# Patient Record
Sex: Female | Born: 1955 | Race: White | Hispanic: No | Marital: Married | Smoking: Never smoker
Health system: Southern US, Community
[De-identification: ages and names within clinical notes are randomized; demographics above are authoritative.]

## PROBLEM LIST (undated history)

## (undated) DIAGNOSIS — I10 Essential (primary) hypertension: Secondary | ICD-10-CM

## (undated) DIAGNOSIS — E119 Type 2 diabetes mellitus without complications: Secondary | ICD-10-CM

---

## 2004-05-24 ENCOUNTER — Other Ambulatory Visit: Admission: RE | Admit: 2004-05-24 | Discharge: 2004-05-24 | Payer: Self-pay | Admitting: Family Medicine

## 2007-09-30 ENCOUNTER — Observation Stay (HOSPITAL_COMMUNITY): Admission: EM | Admit: 2007-09-30 | Discharge: 2007-10-02 | Payer: Self-pay | Admitting: Emergency Medicine

## 2007-09-30 IMAGING — CR DG CHEST 2V
2 series · 2 of 2 positions shown · non-contrast
Comparison: None.

CHEST - 2 VIEW
CLINICAL DATA: Short of breath, fever.

[w chest pa]
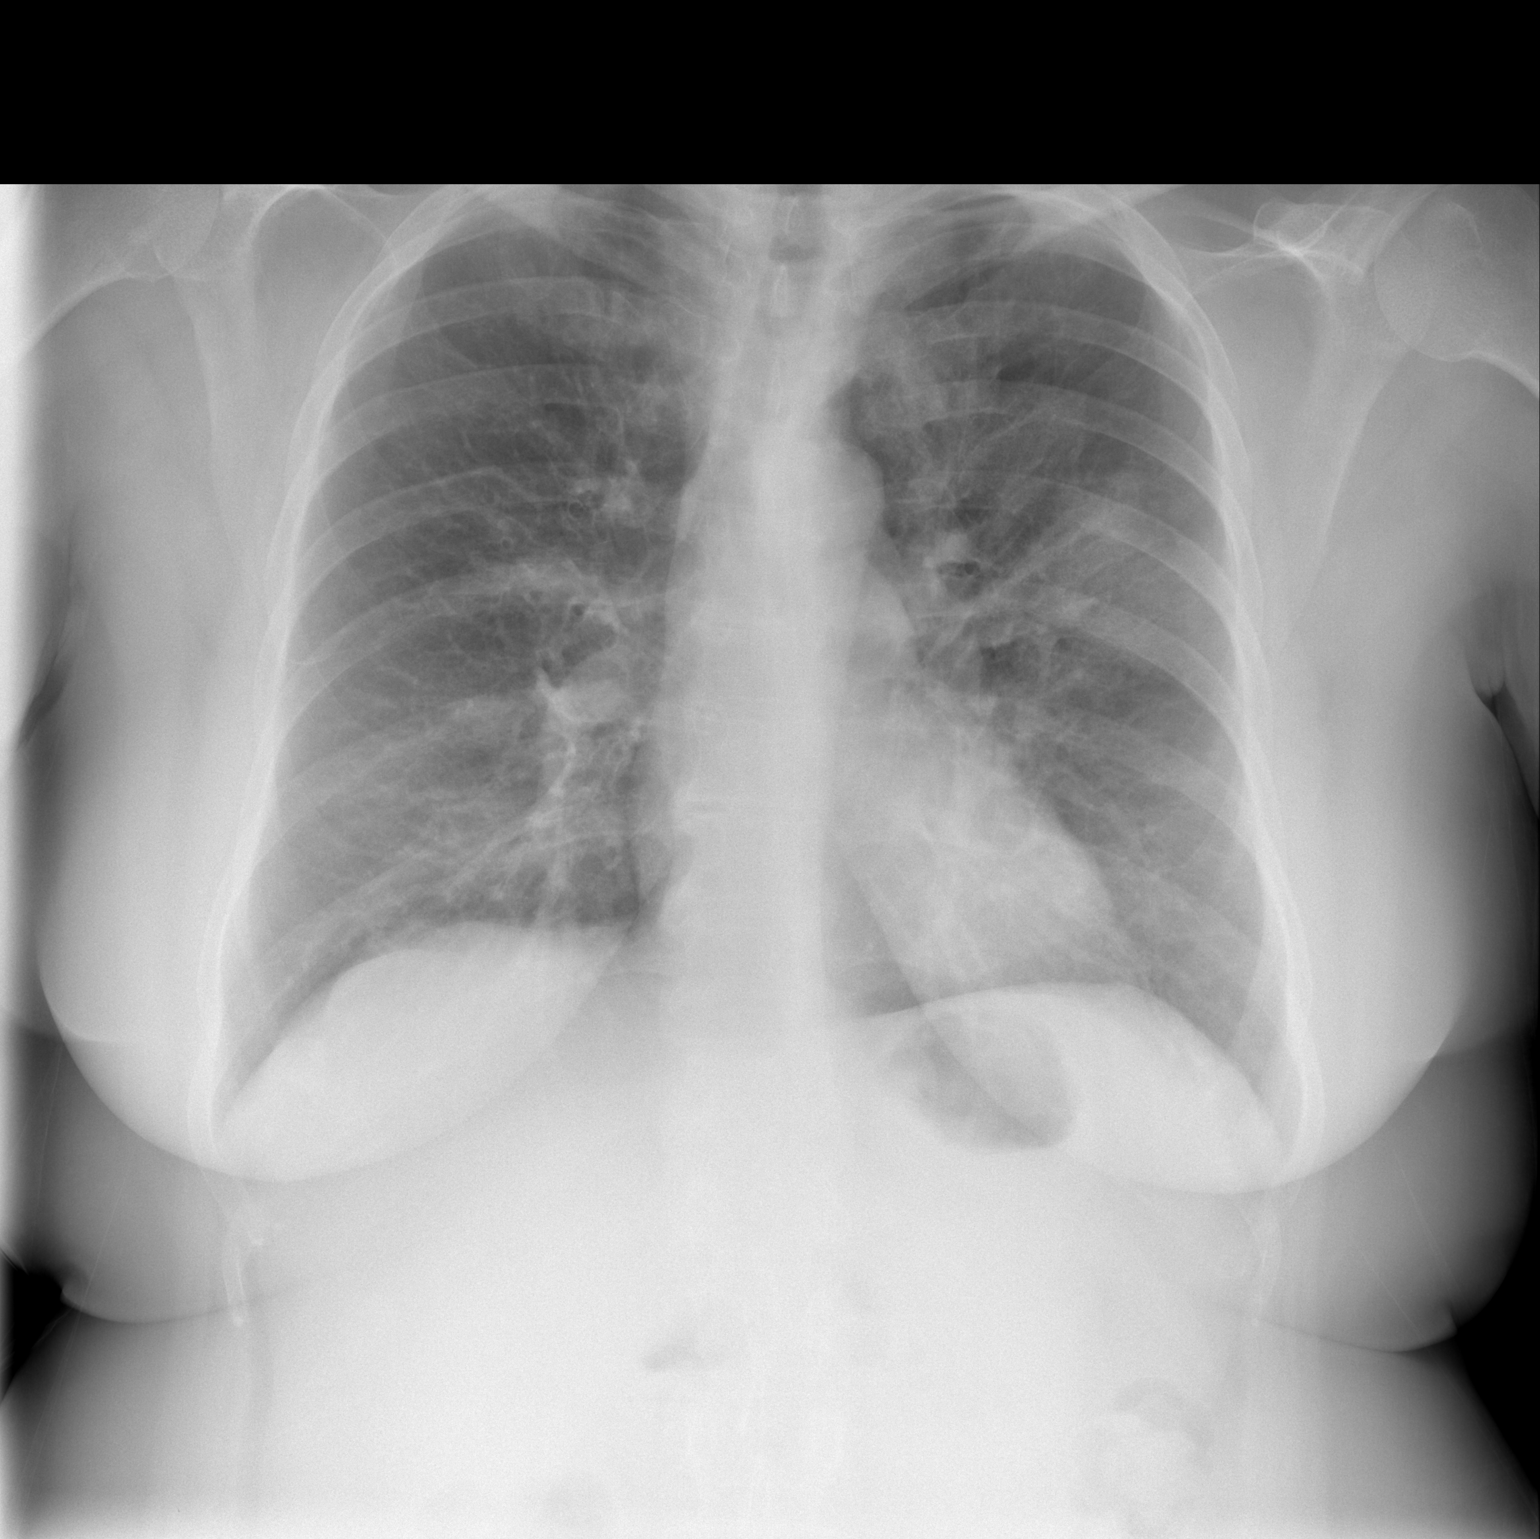

[w chest lat]
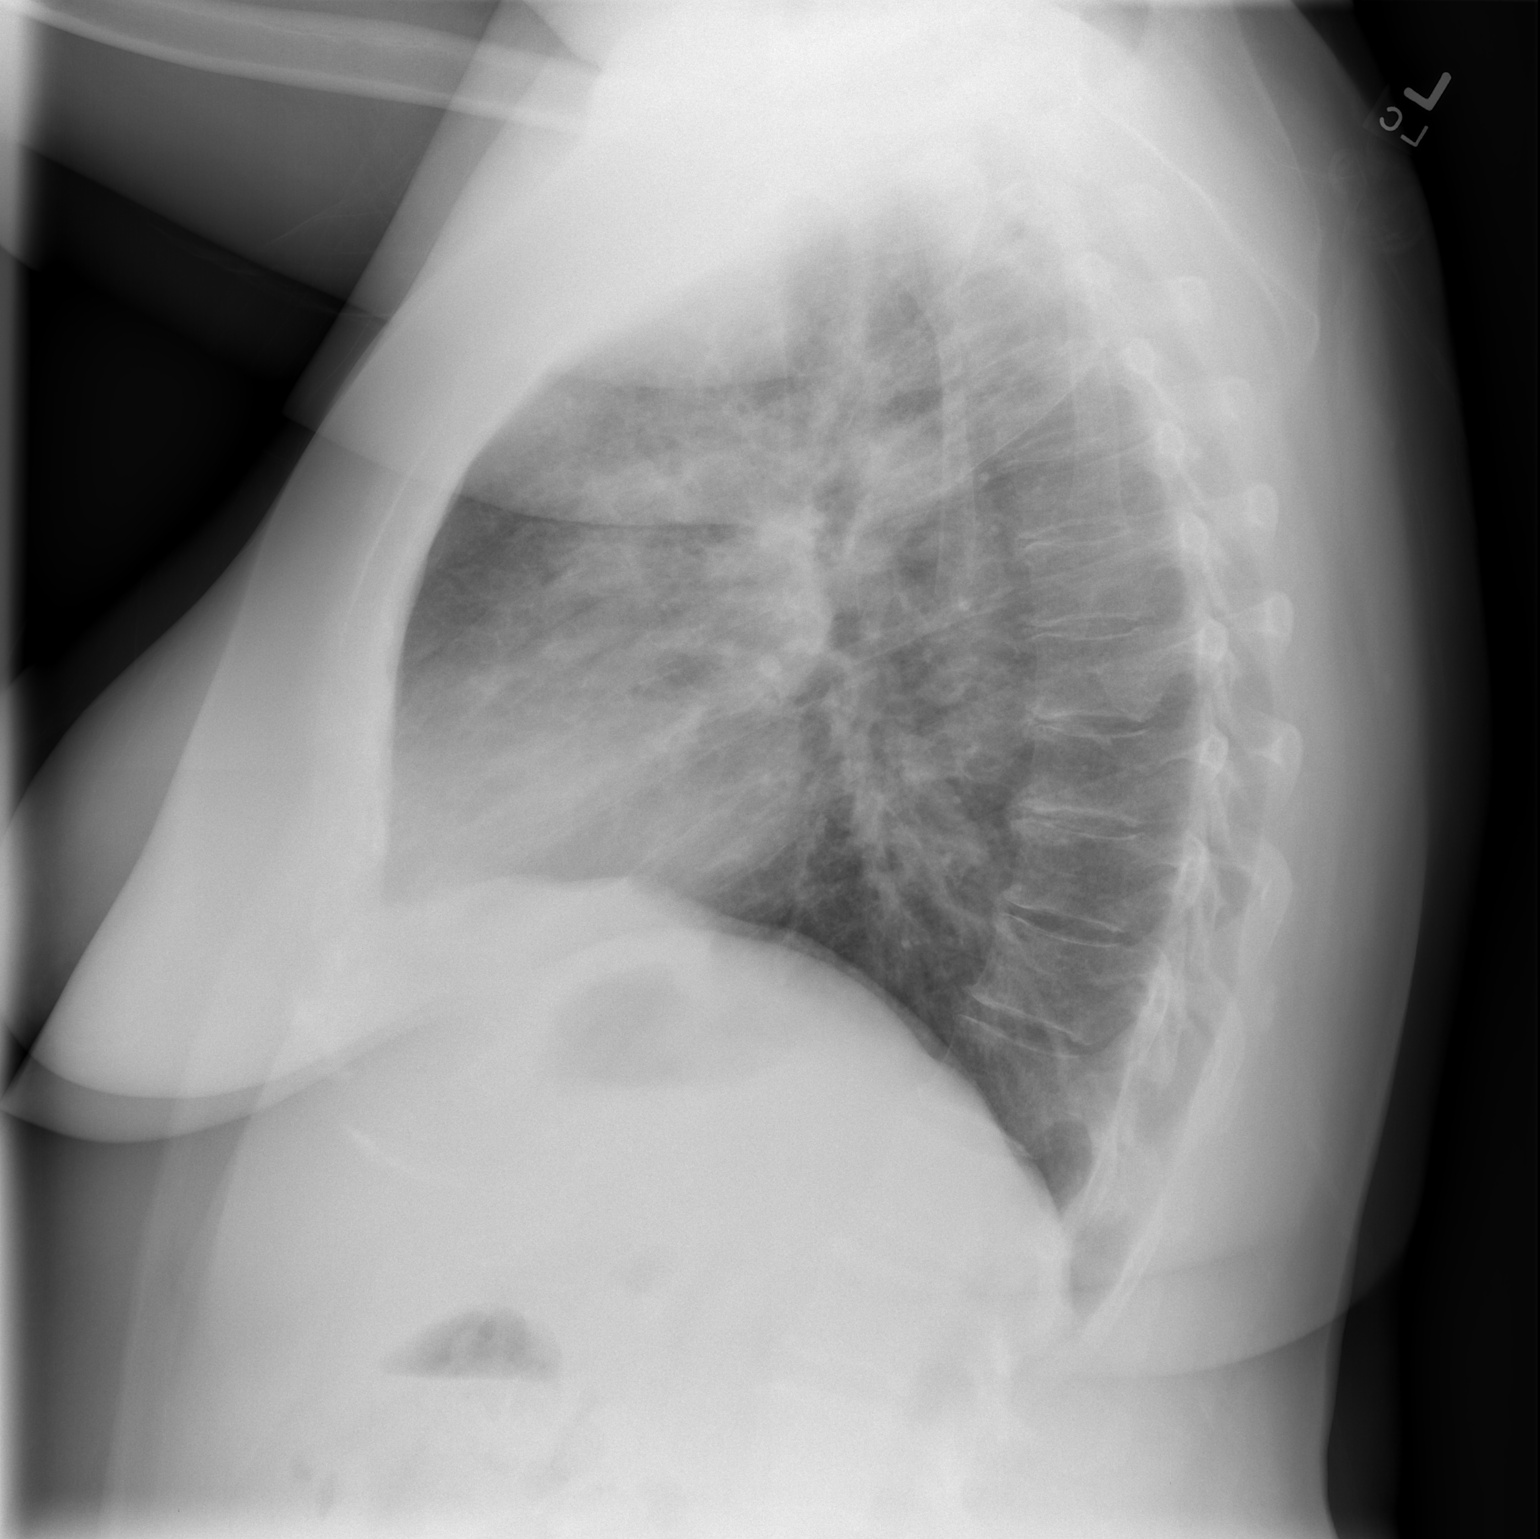

[2 of 2 positions shown; findings below may reference images not displayed]

FINDINGS: Airspace disease is present within the left upper lobe, consistent
with pneumonia. No effusion. Cardiomediastinal contours within normal limits.
Followup until radiographically clearing recommended.
**********************************************************************
IMPRESSION

1. Left upper lobe pneumonia. 
**********************************************************************

## 2010-12-17 NOTE — H&P (Signed)
NAMECORLISS, COGGESHALL NO.:  [id number]   MEDICAL RECORD NO.:  [id number]          PATIENT TYPE:  EMS   LOCATION:  ED                           FACILITY:  [facility]   PHYSICIAN:  Hollice Espy, M.D.DATE OF BIRTH:  May 05, 1956   DATE OF ADMISSION:  10/01/2007  DATE OF DISCHARGE:                              HISTORY & PHYSICAL   PRIMARY CARE PHYSICIAN:  Dr. Joycelyn Rua.   CHIEF COMPLAINT:  Shortness of breath.   HISTORY OF PRESENT ILLNESS:  The patient is a 55 year old, white female  with a past medical history of tobacco abuse, hypertension and obesity  who for the last week has been having problems with shortness of breath  and productive cough with green sputum. She was put on outpatient  Zithromax but her symptoms have continued to progress to the point where  she could not take anymore, felt more dyspneic and came to the emergency  room. In the emergency room, she was noted to have a temperature of 100  on admission. Initially her sats run 95% on 2 liters. She was given a  few breathing treatments and initially the plan was to discharge her  home; however, when taking her off oxygen her sats fell down to 86% on  room air  and she was put back on oxygen at 4 liters. Currently she is  receiving a breathing treatment.  She feels slightly short of breath  although better with her oxygen and breathing treatment.  She denies any  headaches, vision changes or dysphagia.  No chest pain or palpitations.  She does have shortness of breath, dyspnea on exertion, wheezing and  coughing.  She denies any abdominal pain.  No hematuria, dysuria,  constipation, diarrhea. No focal extremity numbness, weakness or pain.   REVIEW OF SYSTEMS:  Otherwise negative.   PAST MEDICAL HISTORY:  Hypertension, obesity and tobacco abuse about a  pack of cigarettes a day. Marland Kitchen   MEDICATIONS:  1. Albuterol p.r.n.  2. Benicar 20.  3. K-Dur 20 mEq.  4. Mucinex p.r.n.  5. Triamterene/HCTZ  37.5/25 p.o. daily.  6. Most recently on a prednisone taper.  7. Zithromax.   ALLERGIES:  The patient has no known drug allergies.   SOCIAL HISTORY:  She denies any heavy alcohol or drug use.  She does  smoke about a pack per day.   FAMILY HISTORY:  Noncontributory.   PHYSICAL EXAMINATION:  VITAL SIGNS:  On admission temperature 100, heart  rate 118 now down to 103, blood pressure 149/63 now down to 99/62,  respirations 22, O2 sat 94% on 4 liters, 86% on room air.  GENERAL:  She is alert and oriented x3 in some mild respiratory  distress.  HEENT: Normocephalic atraumatic.  Mucous membranes are slightly dry. She  has no carotid bruits.  HEART:  Regular rate and rhythm. S1, S2.  LUNGS:  She has bilateral expiratory wheezes throughout, a few scattered  rales.  ABDOMEN:  Soft, nontender, obese, positive bowel sounds.  EXTREMITIES:  No clubbing, cyanosis or edema.   LABORATORY DATA:  White count 12.5, H&H 13  and 37, MCV of 83, platelet  count 201, 88% shift.  Sodium 132, potassium 3.4, chloride 97, bicarb  25, BUN 27, creatinine 0.8, glucose 140.  ABG is pending.   Chest x-ray shows a left upper lobe pneumonia.   ASSESSMENT/PLAN:  1. Pneumonia with hypoxia. IV antibiotics, oxygen and nebs.  Suspect      underlying chronic obstructive pulmonary disease given wheezing and      long tobacco history. Will add steroids.  2. Tobacco abuse, nicotine patch.  3. Hypertension.  Continue medications.  4. Obesity.  5. Hypokalemia. Will replace.      Hollice Espy, M.D.  Electronically Signed     SKK/MEDQ  D:  09/30/2007  T:  09/30/2007  Job:  [id number]   cc:   Joycelyn Rua, M.D.  Fax: [phone]

## 2011-04-25 LAB — BLOOD GAS, ARTERIAL
Drawn by: 103701
O2 Content: 2
TCO2: 21.9
pCO2 arterial: 36.3
pH, Arterial: 7.45 — ABNORMAL HIGH
pO2, Arterial: 61.3 — ABNORMAL LOW

## 2011-04-25 LAB — DIFFERENTIAL
Basophils Relative: 0
Lymphocytes Relative: 10 — ABNORMAL LOW
Lymphs Abs: 1.2
Monocytes Relative: 2 — ABNORMAL LOW
Neutro Abs: 11 — ABNORMAL HIGH
Neutrophils Relative %: 88 — ABNORMAL HIGH

## 2011-04-25 LAB — CBC
HCT: 38.1
Hemoglobin: 13.4
MCV: 84.4
RBC: 4.51
RDW: 13.7
WBC: 12.5 — ABNORMAL HIGH

## 2011-04-25 LAB — BASIC METABOLIC PANEL
CO2: 30
Calcium: 8.1 — ABNORMAL LOW
Chloride: 103
Creatinine, Ser: 0.89
GFR calc Af Amer: >60
GFR calc non Af Amer: >60
Glucose, Bld: 159 — ABNORMAL HIGH
Potassium: 4.2
Sodium: 132 — ABNORMAL LOW
Sodium: 140

## 2021-01-25 ENCOUNTER — Other Ambulatory Visit: Payer: Self-pay

## 2021-01-25 ENCOUNTER — Observation Stay (HOSPITAL_COMMUNITY)
Admission: EM | Admit: 2021-01-25 | Discharge: 2021-01-26 | Disposition: A | Discharge status: Home / Self Care | Payer: BC Managed Care – PPO | Attending: Family Medicine | Admitting: Family Medicine

## 2021-01-25 ENCOUNTER — Observation Stay (HOSPITAL_COMMUNITY): Payer: BC Managed Care – PPO

## 2021-01-25 ENCOUNTER — Encounter (HOSPITAL_COMMUNITY): Payer: Self-pay

## 2021-01-25 DIAGNOSIS — F17210 Nicotine dependence, cigarettes, uncomplicated: Secondary | ICD-10-CM | POA: Diagnosis not present

## 2021-01-25 DIAGNOSIS — Z7982 Long term (current) use of aspirin: Secondary | ICD-10-CM | POA: Insufficient documentation

## 2021-01-25 DIAGNOSIS — H34232 Retinal artery branch occlusion, left eye: Secondary | ICD-10-CM | POA: Diagnosis not present

## 2021-01-25 DIAGNOSIS — H547 Unspecified visual loss: Secondary | ICD-10-CM

## 2021-01-25 DIAGNOSIS — Z79899 Other long term (current) drug therapy: Secondary | ICD-10-CM | POA: Diagnosis not present

## 2021-01-25 DIAGNOSIS — E119 Type 2 diabetes mellitus without complications: Secondary | ICD-10-CM | POA: Diagnosis not present

## 2021-01-25 DIAGNOSIS — Y900 Blood alcohol level of less than 20 mg/100 ml: Secondary | ICD-10-CM | POA: Insufficient documentation

## 2021-01-25 DIAGNOSIS — Z20822 Contact with and (suspected) exposure to covid-19: Secondary | ICD-10-CM | POA: Diagnosis not present

## 2021-01-25 DIAGNOSIS — Z7984 Long term (current) use of oral hypoglycemic drugs: Secondary | ICD-10-CM | POA: Insufficient documentation

## 2021-01-25 DIAGNOSIS — H539 Unspecified visual disturbance: Secondary | ICD-10-CM

## 2021-01-25 DIAGNOSIS — I10 Essential (primary) hypertension: Secondary | ICD-10-CM | POA: Diagnosis not present

## 2021-01-25 HISTORY — DX: Essential (primary) hypertension: I10

## 2021-01-25 HISTORY — DX: Type 2 diabetes mellitus without complications: E11.9

## 2021-01-25 LAB — RAPID URINE DRUG SCREEN, HOSP PERFORMED
Amphetamines: NOT DETECTED
Barbiturates: NOT DETECTED
Benzodiazepines: NOT DETECTED
Cocaine: NOT DETECTED
Opiates: NOT DETECTED
Tetrahydrocannabinol: NOT DETECTED

## 2021-01-25 LAB — URINALYSIS, ROUTINE W REFLEX MICROSCOPIC
Bilirubin Urine: NEGATIVE
Glucose, UA: NEGATIVE mg/dL
Ketones, ur: NEGATIVE mg/dL
Nitrite: NEGATIVE
Protein, ur: NEGATIVE mg/dL
Specific Gravity, Urine: 1.016 (ref 1.005–1.030)
pH: 5 (ref 5.0–8.0)

## 2021-01-25 LAB — ETHANOL: Alcohol, Ethyl (B): <10 mg/dL (ref <10)

## 2021-01-25 LAB — CBG MONITORING, ED: Glucose-Capillary: 112 mg/dL — ABNORMAL HIGH (ref 70–99)

## 2021-01-25 LAB — COMPREHENSIVE METABOLIC PANEL
ALT: 18 U/L (ref 0–44)
AST: 17 U/L (ref 15–41)
Albumin: 3.9 g/dL (ref 3.5–5.0)
Alkaline Phosphatase: 72 U/L (ref 38–126)
Anion gap: 8 (ref 5–15)
BUN: 19 mg/dL (ref 8–23)
CO2: 28 mmol/L (ref 22–32)
Calcium: 9.1 mg/dL (ref 8.9–10.3)
Chloride: 102 mmol/L (ref 98–111)
Creatinine, Ser: 0.87 mg/dL (ref 0.44–1.00)
GFR, Estimated: >60 mL/min (ref >60)
Glucose, Bld: 118 mg/dL — ABNORMAL HIGH (ref 70–99)
Potassium: 3.9 mmol/L (ref 3.5–5.1)
Sodium: 138 mmol/L (ref 135–145)
Total Bilirubin: 0.8 mg/dL (ref 0.3–1.2)
Total Protein: 7.6 g/dL (ref 6.5–8.1)

## 2021-01-25 LAB — CBC
HCT: 42.1 % (ref 36.0–46.0)
Hemoglobin: 14 g/dL (ref 12.0–15.0)
MCH: 28.7 pg (ref 26.0–34.0)
MCHC: 33.3 g/dL (ref 30.0–36.0)
MCV: 86.3 fL (ref 80.0–100.0)
Platelets: 273 10*3/uL (ref 150–400)
RBC: 4.88 MIL/uL (ref 3.87–5.11)
RDW: 15 % (ref 11.5–15.5)
WBC: 11.2 10*3/uL — ABNORMAL HIGH (ref 4.0–10.5)
nRBC: 0 % (ref 0.0–0.2)

## 2021-01-25 LAB — DIFFERENTIAL
Abs Immature Granulocytes: 0.02 10*3/uL (ref 0.00–0.07)
Basophils Absolute: 0.1 10*3/uL (ref 0.0–0.1)
Basophils Relative: 1 %
Eosinophils Absolute: 0.3 10*3/uL (ref 0.0–0.5)
Eosinophils Relative: 2 %
Immature Granulocytes: 0 %
Lymphocytes Relative: 24 %
Lymphs Abs: 2.6 10*3/uL (ref 0.7–4.0)
Monocytes Absolute: 0.7 10*3/uL (ref 0.1–1.0)
Monocytes Relative: 6 %
Neutro Abs: 7.5 10*3/uL (ref 1.7–7.7)
Neutrophils Relative %: 67 %

## 2021-01-25 LAB — PROTIME-INR
INR: 1 (ref 0.8–1.2)
Prothrombin Time: 13.2 seconds (ref 11.4–15.2)

## 2021-01-25 LAB — GLUCOSE, CAPILLARY: Glucose-Capillary: 90 mg/dL (ref 70–99)

## 2021-01-25 LAB — APTT: aPTT: 29 seconds (ref 24–36)

## 2021-01-25 IMAGING — MR MR MRA NECK WO/W CM
4 of 6 series · 15 of 48 positions shown · IV contrast (9 GAD)
Comparison: None available.

CLINICAL DATA: Initial evaluation for neuro deficit, stroke, TIA.

EXAM:
MRI HEAD WITHOUT CONTRAST
MRA HEAD WITHOUT CONTRAST
MRA NECK WITHOUT AND WITH CONTRAST
TECHNIQUE: Multiplanar, multi-echo pulse sequences of the brain and surrounding
structures were acquired without intravenous contrast. Angiographic
images of the Circle of Willis were acquired using MRA technique
without intravenous contrast. Angiographic images of the neck were
acquired using MRA technique without and with intravenous contrast.
Carotid stenosis measurements (when applicable) are obtained
utilizing NASCET criteria, using the distal internal carotid
diameter as the denominator.
CONTRAST:  9mL GADAVIST GADOBUTROL 1 MMOL/ML IV SOLN

[Series 1400: cor cemra ft · coronal · 1.2mm · 0.59mm/px · 6 of 109 slices shown]
[im 1/109]
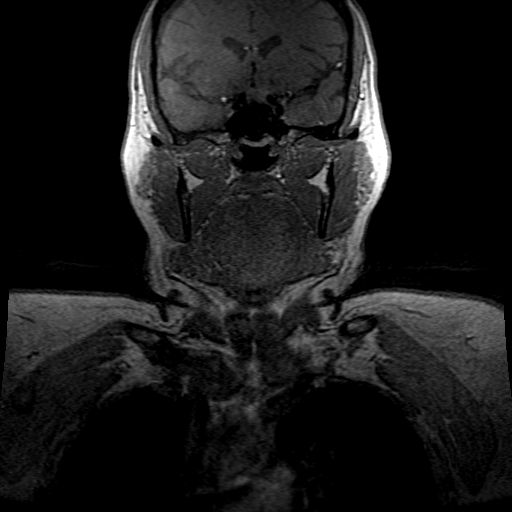
[im 16/109]
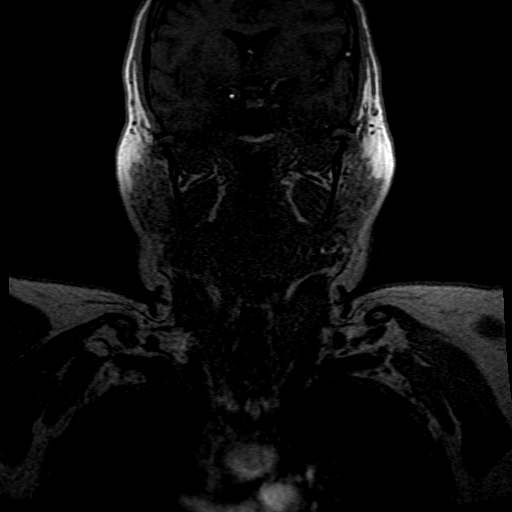
[im 31/109]
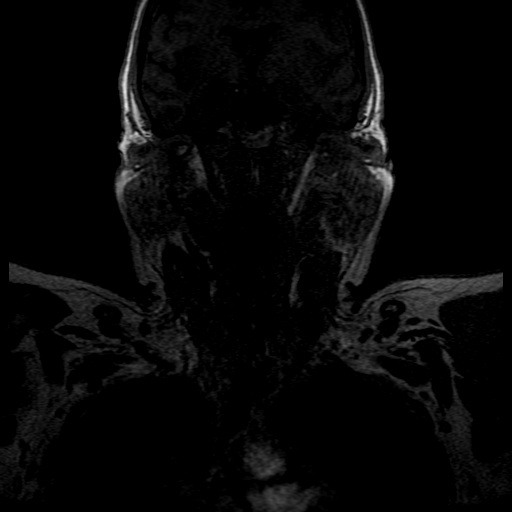
[im 47/109]
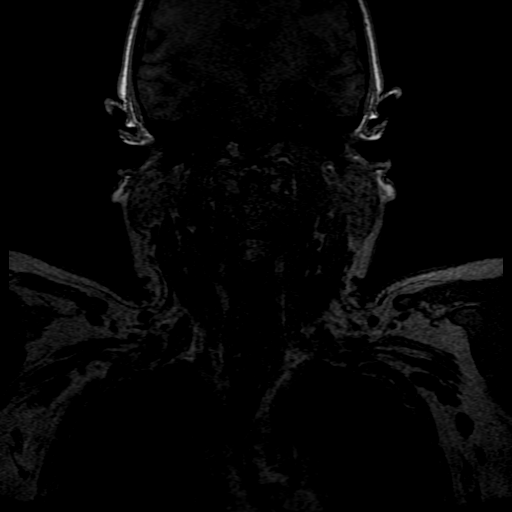
[im 62/109]
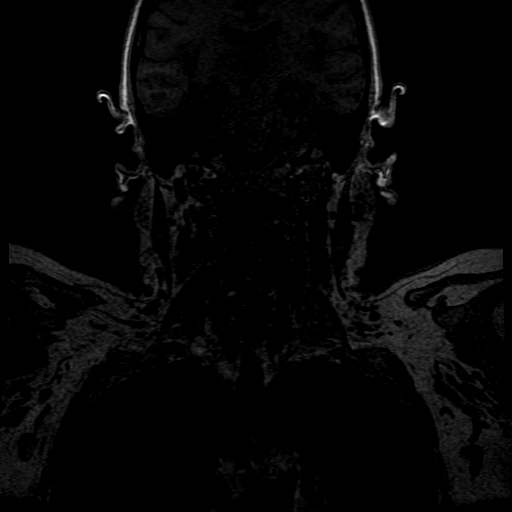
[im 93/109]
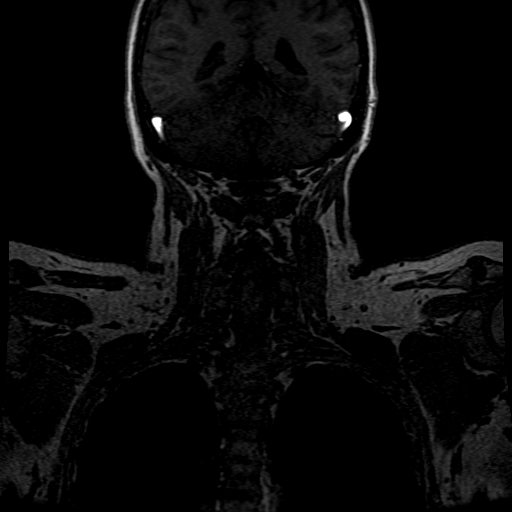

[Series 1401: ph1/cor cemra ft · coronal · 1.2mm · 0.59mm/px · 3 of 108 slices shown]
[im 18/108]
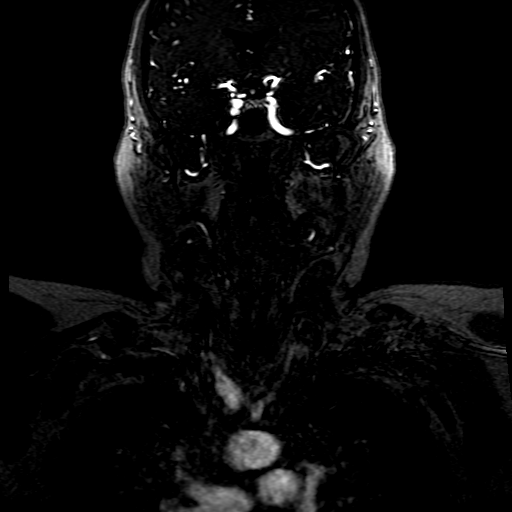
[im 54/108]
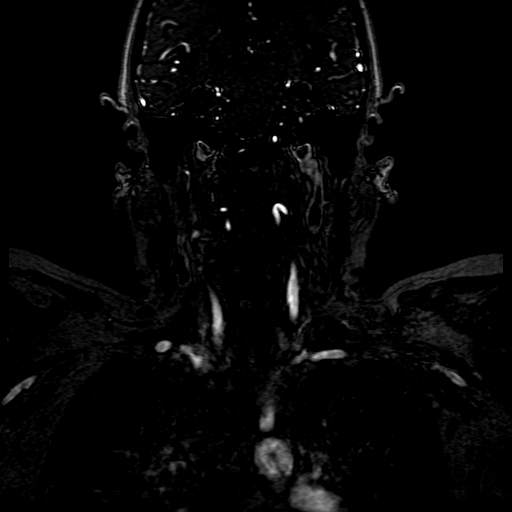
[im 90/108]
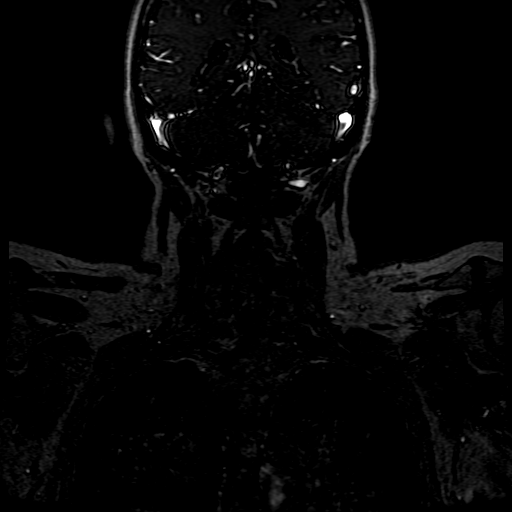

[Series 1402: ph2/cor cemra ft · coronal · 1.2mm · 0.59mm/px · 3 of 108 slices shown]
[im 18/108]
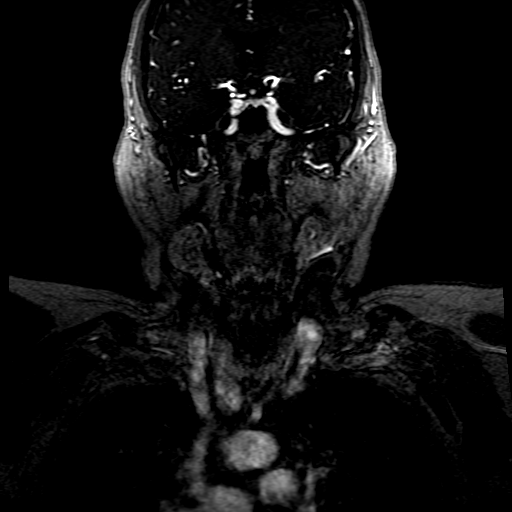
[im 54/108]
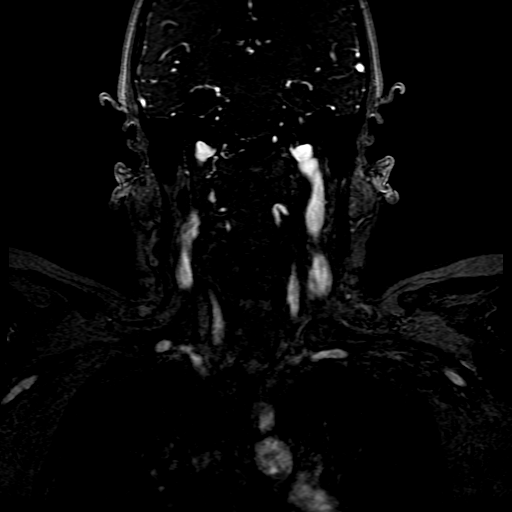
[im 90/108]
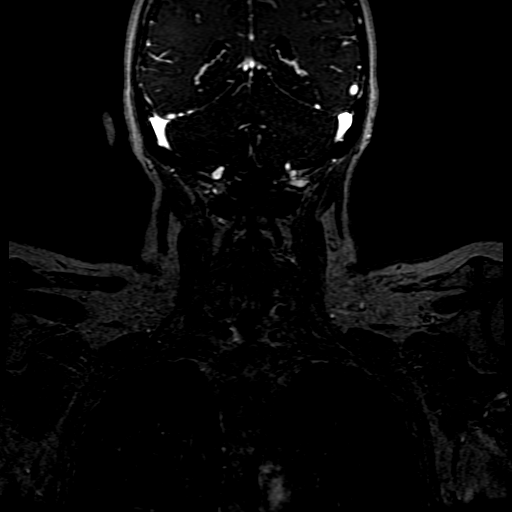

[((date))-((date)) · coronal · 1.2mm · 0.59mm/px · 3 of 109 slices shown]
[im 16/109]
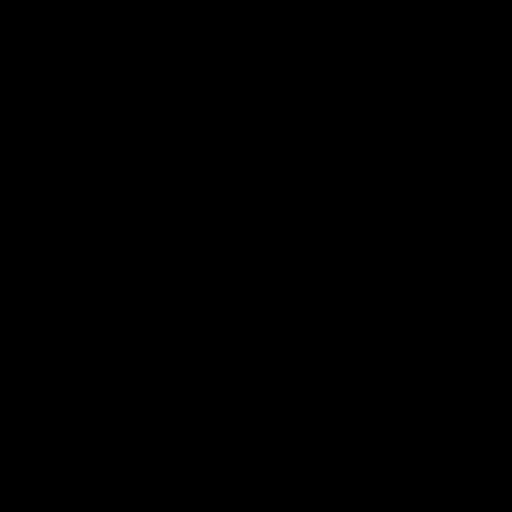
[im 62/109]
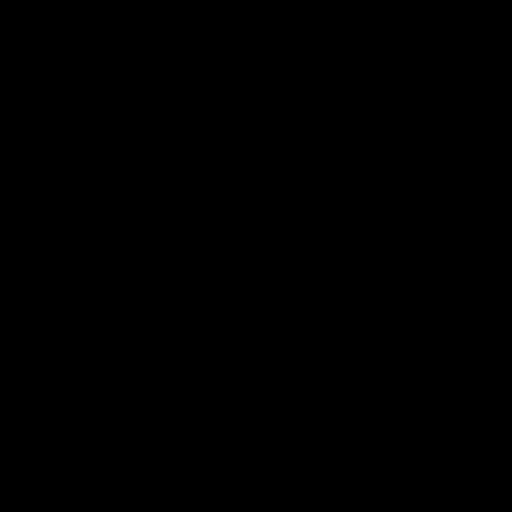
[im 93/109]
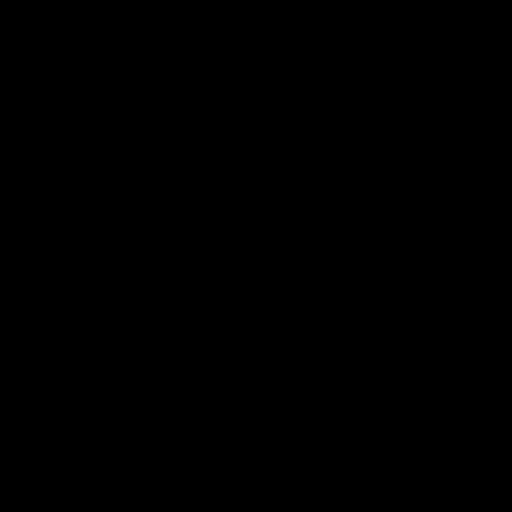

[15 of 48 positions shown; findings below may reference images not displayed]

FINDINGS: MRI HEAD FINDINGS

Brain: Cerebral volume within normal limits for age. Scattered
patchy T2/FLAIR hyperintensity seen within the periventricular and
deep white matter both cerebral hemispheres as well as the pons,
nonspecific, but most likely related chronic microvascular ischemic
disease, mild in nature.

No abnormal foci of restricted diffusion to suggest acute or
subacute ischemia. Gray-white matter differentiation maintained. No
encephalomalacia to suggest chronic cortical infarction. No evidence
for acute or chronic intracranial hemorrhage.

No mass lesion, midline shift or mass effect. No hydrocephalus or
extra-axial fluid collection. Pituitary gland suprasellar region
within normal limits. Midline structures intact and normal.

Vascular: Major intracranial vascular flow voids are maintained.

Skull and upper cervical spine: Craniocervical junction within
normal limits. Bone marrow signal intensity normal. No scalp soft
tissue abnormality.

Sinuses/Orbits: Globes and orbital soft tissues within normal
limits. Paranasal sinuses are clear. Trace left mastoid effusion
noted, of doubtful significance. Inner ear structures grossly
normal.

Other: None.

MRA HEAD FINDINGS

Anterior circulation: Visualized distal cervical segments of the
internal carotid arteries are patent with antegrade flow. Petrous,
cavernous, and supraclinoid segments patent without stenosis. 4 mm
outpouching extending laterally from the cavernous segment of the
right ICA consistent with an aneurysm (series 4, image 77). A1
segments patent bilaterally. Left A1 partially
fenestrated/duplicated. Normal anterior communicating artery
complex. Anterior cerebral arteries patent to their distal aspects
without stenosis. No M1 stenosis or occlusion. Normal MCA
bifurcations. Distal MCA branches well perfused and symmetric.

Posterior circulation: Left vertebral artery dominant and widely
patent to the vertebrobasilar junction. Right vertebral artery
hypoplastic and largely terminates in PICA, although a tiny branch
ascending towards the vertebrobasilar junction on time-of-flight
sequence. Both PICA origins patent and normal. Basilar patent to its
distal aspect without stenosis. Superior cerebral arteries patent
bilaterally. PCA supplied via the basilar as well as robust
bilateral posterior communicating arteries. PCAs well perfused to
their distal aspects without stenosis.

Anatomic variants: Hypoplastic right vertebral artery largely
terminates in PICA.

MRA NECK FINDINGS

Aortic arch: Examination degraded by motion artifact.

Visualized aortic arch normal caliber with normal 3 vessel
morphology. No hemodynamically significant stenosis seen about the
origin of the great vessels.

Right carotid system: Right CCA patent from its origin to the
bifurcation without stenosis. No significant atheromatous narrowing
about the right carotid bulb. Focal tortuosity with apparent
irregularity involving the proximal right ICA noted, favored to be
artifactual due to motion artifact through this region (series [KM],
image 8). The right ICA appears to be patent without significant
stenosis at this level on corresponding time-of-flight sequence.
Right ICA patent distally to the skull base without stenosis,
evidence for dissection, or occlusion.

Left carotid system: Left CCA patent from its origin to the
bifurcation without stenosis. Left carotid bifurcation somewhat low
lying in the neck. No significant atheromatous narrowing about the
left carotid bulb. Left ICA patent distally without stenosis,
evidence for dissection or occlusion.

Vertebral arteries: Both vertebral arteries arise from the
subclavian arteries. No proximal subclavian artery stenosis. Left
vertebral artery dominant. Possible short-segment moderate stenosis
versus artifact noted involving the pre foraminal left V1 segment
(series [KM], image 7). Vertebral arteries otherwise patent within
the neck without stenosis, evidence for dissection or occlusion.

Other: Note made of a possible additional moderate to severe
stenosis involving the distal left subclavian artery (series [KM],
image 12). Again, there is some motion artifact through this region,
limiting the certainty of this finding.
IMPRESSION: MRI HEAD:

1. No acute intracranial infarct or other abnormality.
2. Mild chronic microvascular ischemic disease for age.

MRA HEAD:

1. Negative intracranial MRA for large vessel occlusion. No
hemodynamically significant or correctable stenosis.
2. 4 mm cavernous right ICA aneurysm.

MRA NECK:

1. Motion degraded exam.
2. Focal tortuosity with apparent irregularity involving the
proximal right ICA, favored to be artifactual due to motion artifact
through this region. No other hemodynamically significant stenosis
about either carotid artery system.
3. Possible short-segment moderate stenosis versus artifact
involving the pre foraminal left V1 segment. Vertebral arteries
otherwise patent within the neck.
4. Possible additional moderate to severe stenosis involving the
distal left subclavian artery as above.

## 2021-01-25 IMAGING — MR MR MRA HEAD W/O CM
1 series · 16 of 48 positions shown · IV contrast (gadavist)
Comparison: None available.

CLINICAL DATA: Initial evaluation for neuro deficit, stroke, TIA.

EXAM:
MRI HEAD WITHOUT CONTRAST
MRA HEAD WITHOUT CONTRAST
MRA NECK WITHOUT AND WITH CONTRAST
TECHNIQUE: Multiplanar, multi-echo pulse sequences of the brain and surrounding
structures were acquired without intravenous contrast. Angiographic
images of the Circle of Willis were acquired using MRA technique
without intravenous contrast. Angiographic images of the neck were
acquired using MRA technique without and with intravenous contrast.
Carotid stenosis measurements (when applicable) are obtained
utilizing NASCET criteria, using the distal internal carotid
diameter as the denominator.
CONTRAST:  9mL GADAVIST GADOBUTROL 1 MMOL/ML IV SOLN

[Series 4: ax (id) · axial · 1.0mm · 0.43mm/px · z∈[-91,-5]mm · 16 of 183 slices shown]
[im 1/183]
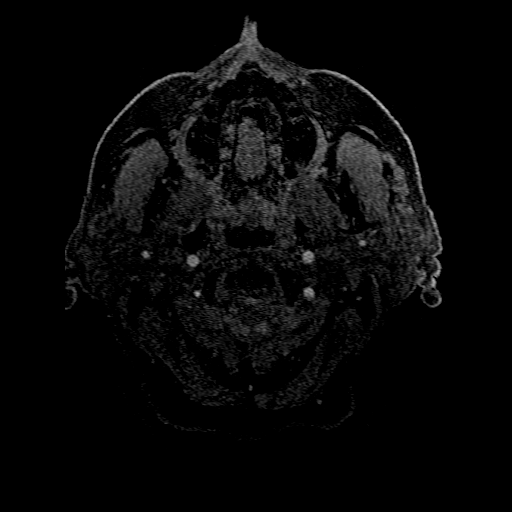
[im 4/183]
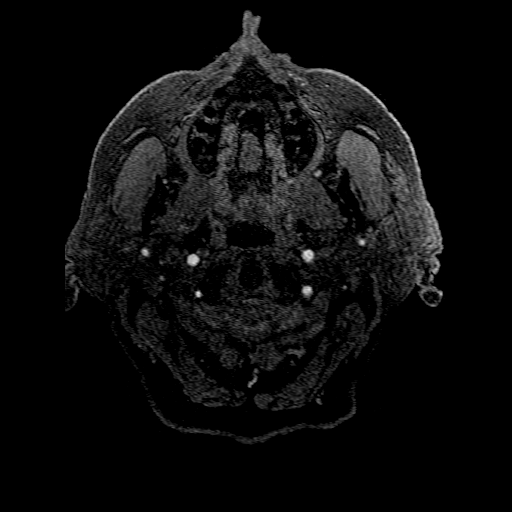
[im 8/183]
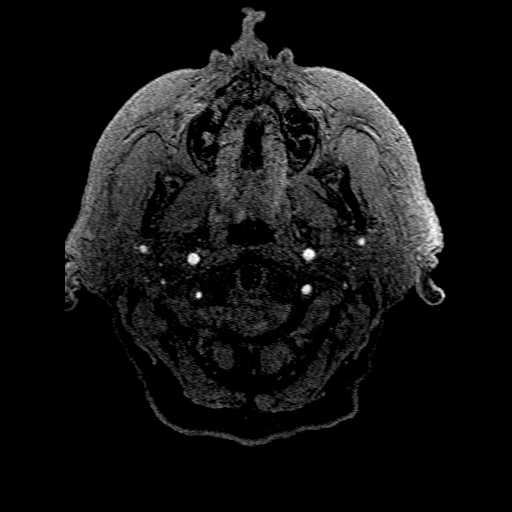
[im 12/183]
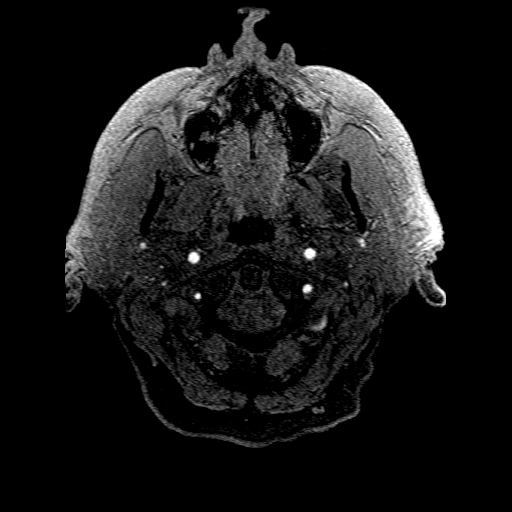
[im 16/183]
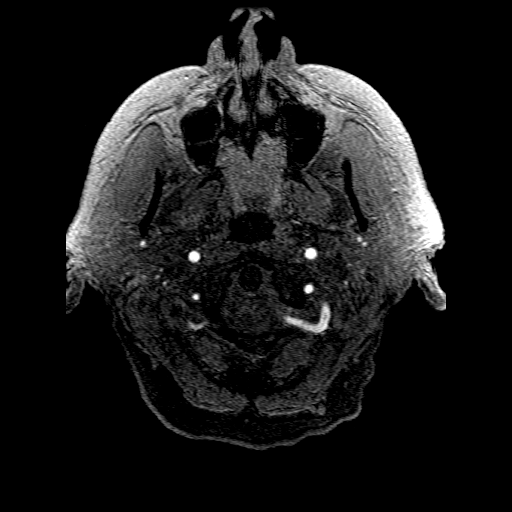
[im 20/183]
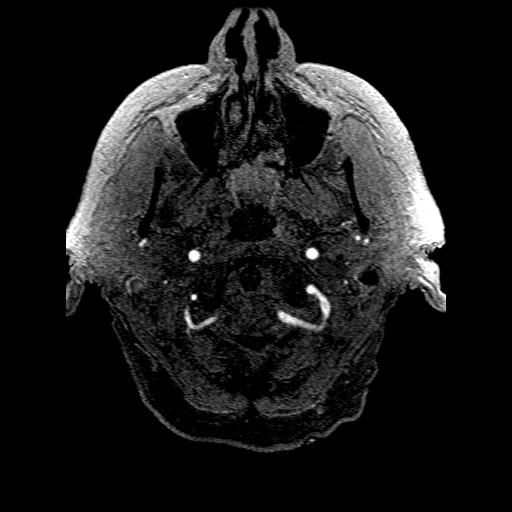
[im 31/183]
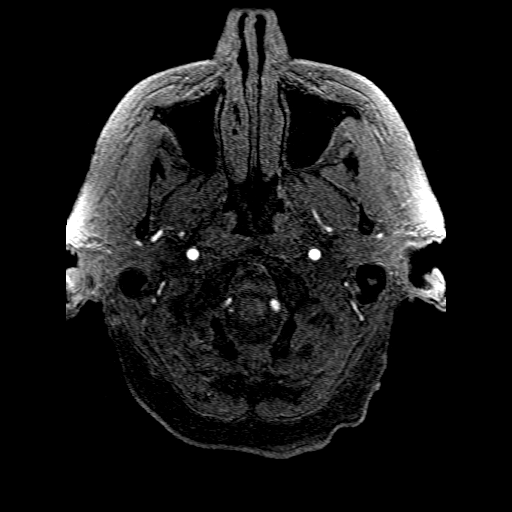
[im 35/183]
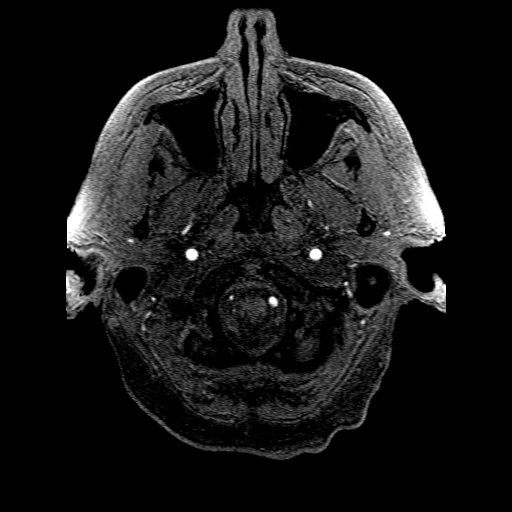
[im 59/183]
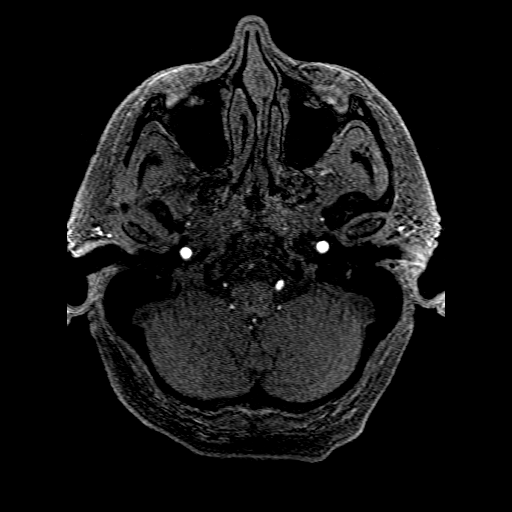
[im 82/183]
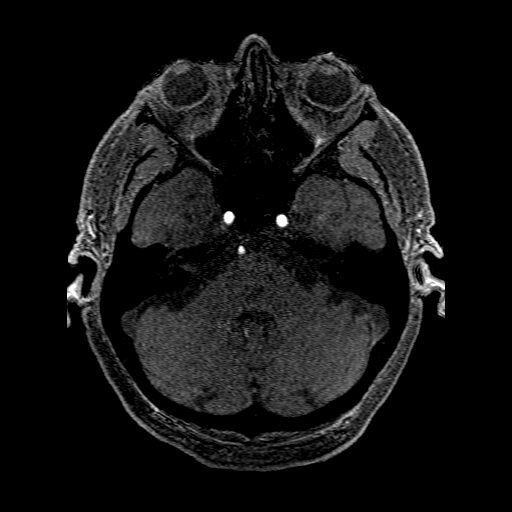
[im 93/183]
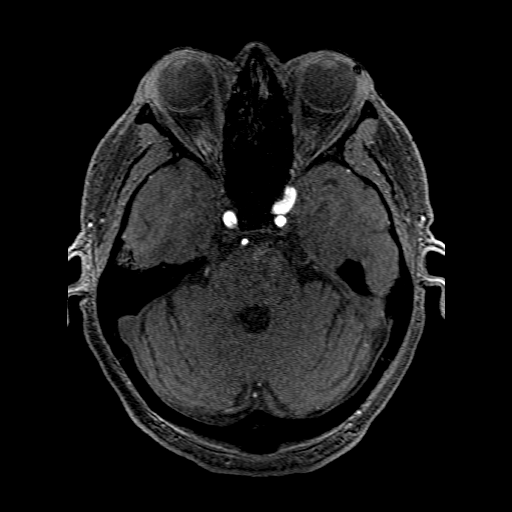
[im 105/183]
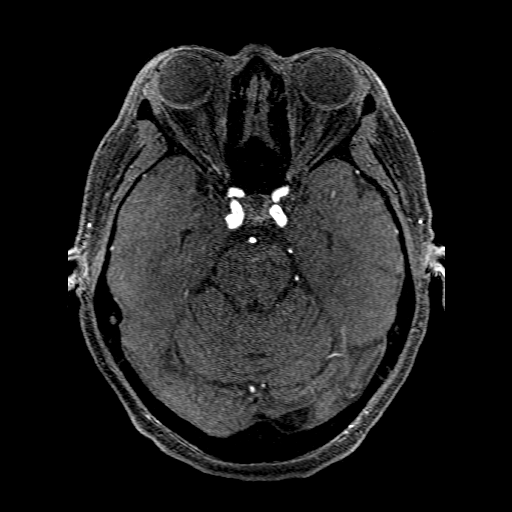
[im 128/183]
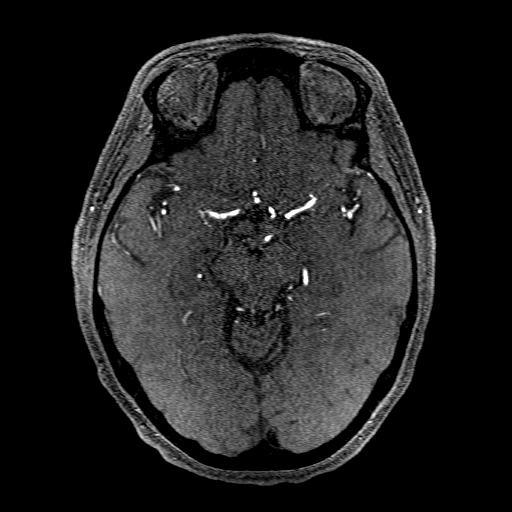
[im 152/183]
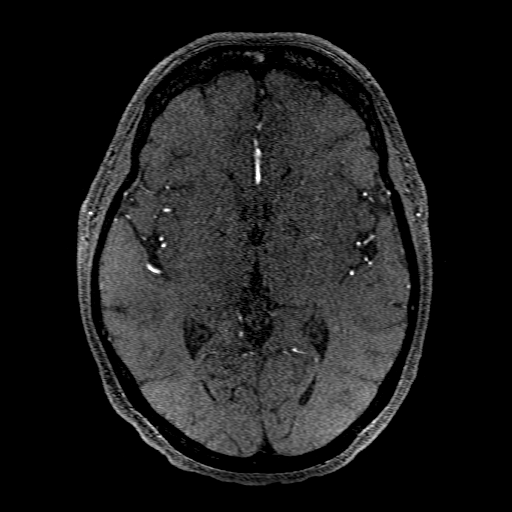
[im 155/183]
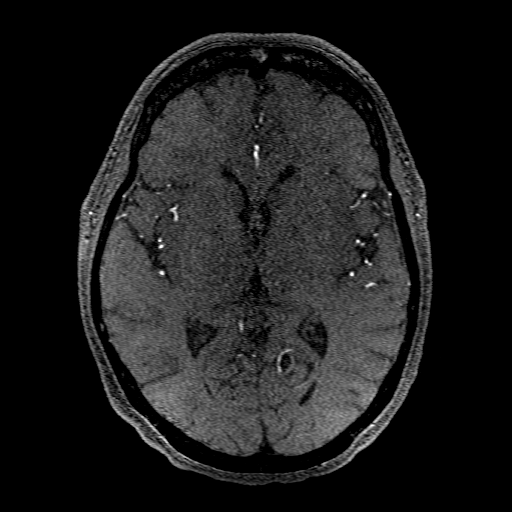
[im 175/183]
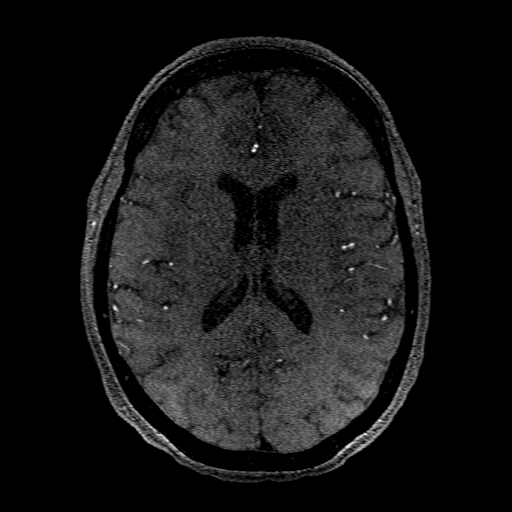

[16 of 48 positions shown; findings below may reference images not displayed]

FINDINGS: MRI HEAD FINDINGS

Brain: Cerebral volume within normal limits for age. Scattered
patchy T2/FLAIR hyperintensity seen within the periventricular and
deep white matter both cerebral hemispheres as well as the pons,
nonspecific, but most likely related chronic microvascular ischemic
disease, mild in nature.

No abnormal foci of restricted diffusion to suggest acute or
subacute ischemia. Gray-white matter differentiation maintained. No
encephalomalacia to suggest chronic cortical infarction. No evidence
for acute or chronic intracranial hemorrhage.

No mass lesion, midline shift or mass effect. No hydrocephalus or
extra-axial fluid collection. Pituitary gland suprasellar region
within normal limits. Midline structures intact and normal.

Vascular: Major intracranial vascular flow voids are maintained.

Skull and upper cervical spine: Craniocervical junction within
normal limits. Bone marrow signal intensity normal. No scalp soft
tissue abnormality.

Sinuses/Orbits: Globes and orbital soft tissues within normal
limits. Paranasal sinuses are clear. Trace left mastoid effusion
noted, of doubtful significance. Inner ear structures grossly
normal.

Other: None.

MRA HEAD FINDINGS

Anterior circulation: Visualized distal cervical segments of the
internal carotid arteries are patent with antegrade flow. Petrous,
cavernous, and supraclinoid segments patent without stenosis. 4 mm
outpouching extending laterally from the cavernous segment of the
right ICA consistent with an aneurysm (series 4, image 77). A1
segments patent bilaterally. Left A1 partially
fenestrated/duplicated. Normal anterior communicating artery
complex. Anterior cerebral arteries patent to their distal aspects
without stenosis. No M1 stenosis or occlusion. Normal MCA
bifurcations. Distal MCA branches well perfused and symmetric.

Posterior circulation: Left vertebral artery dominant and widely
patent to the vertebrobasilar junction. Right vertebral artery
hypoplastic and largely terminates in PICA, although a tiny branch
ascending towards the vertebrobasilar junction on time-of-flight
sequence. Both PICA origins patent and normal. Basilar patent to its
distal aspect without stenosis. Superior cerebral arteries patent
bilaterally. PCA supplied via the basilar as well as robust
bilateral posterior communicating arteries. PCAs well perfused to
their distal aspects without stenosis.

Anatomic variants: Hypoplastic right vertebral artery largely
terminates in PICA.

MRA NECK FINDINGS

Aortic arch: Examination degraded by motion artifact.

Visualized aortic arch normal caliber with normal 3 vessel
morphology. No hemodynamically significant stenosis seen about the
origin of the great vessels.

Right carotid system: Right CCA patent from its origin to the
bifurcation without stenosis. No significant atheromatous narrowing
about the right carotid bulb. Focal tortuosity with apparent
irregularity involving the proximal right ICA noted, favored to be
artifactual due to motion artifact through this region (series [KM],
image 8). The right ICA appears to be patent without significant
stenosis at this level on corresponding time-of-flight sequence.
Right ICA patent distally to the skull base without stenosis,
evidence for dissection, or occlusion.

Left carotid system: Left CCA patent from its origin to the
bifurcation without stenosis. Left carotid bifurcation somewhat low
lying in the neck. No significant atheromatous narrowing about the
left carotid bulb. Left ICA patent distally without stenosis,
evidence for dissection or occlusion.

Vertebral arteries: Both vertebral arteries arise from the
subclavian arteries. No proximal subclavian artery stenosis. Left
vertebral artery dominant. Possible short-segment moderate stenosis
versus artifact noted involving the pre foraminal left V1 segment
(series [KM], image 7). Vertebral arteries otherwise patent within
the neck without stenosis, evidence for dissection or occlusion.

Other: Note made of a possible additional moderate to severe
stenosis involving the distal left subclavian artery (series [KM],
image 12). Again, there is some motion artifact through this region,
limiting the certainty of this finding.
IMPRESSION: MRI HEAD:

1. No acute intracranial infarct or other abnormality.
2. Mild chronic microvascular ischemic disease for age.

MRA HEAD:

1. Negative intracranial MRA for large vessel occlusion. No
hemodynamically significant or correctable stenosis.
2. 4 mm cavernous right ICA aneurysm.

MRA NECK:

1. Motion degraded exam.
2. Focal tortuosity with apparent irregularity involving the
proximal right ICA, favored to be artifactual due to motion artifact
through this region. No other hemodynamically significant stenosis
about either carotid artery system.
3. Possible short-segment moderate stenosis versus artifact
involving the pre foraminal left V1 segment. Vertebral arteries
otherwise patent within the neck.
4. Possible additional moderate to severe stenosis involving the
distal left subclavian artery as above.

## 2021-01-25 IMAGING — MR MR HEAD W/O CM
8 of 11 series · 28 of 48 positions shown · IV contrast (gadavist)
Comparison: None available.

CLINICAL DATA: Initial evaluation for neuro deficit, stroke, TIA.

EXAM:
MRI HEAD WITHOUT CONTRAST
MRA HEAD WITHOUT CONTRAST
MRA NECK WITHOUT AND WITH CONTRAST
TECHNIQUE: Multiplanar, multi-echo pulse sequences of the brain and surrounding
structures were acquired without intravenous contrast. Angiographic
images of the Circle of Willis were acquired using MRA technique
without intravenous contrast. Angiographic images of the neck were
acquired using MRA technique without and with intravenous contrast.
Carotid stenosis measurements (when applicable) are obtained
utilizing NASCET criteria, using the distal internal carotid
diameter as the denominator.
CONTRAST:  9mL GADAVIST GADOBUTROL 1 MMOL/ML IV SOLN

[Series 5: DWI · axial · 3.0mm · 0.94mm/px · z∈[-93,+55]mm · 7 of 102 slices shown (1 of 2)]
[im 1/102]
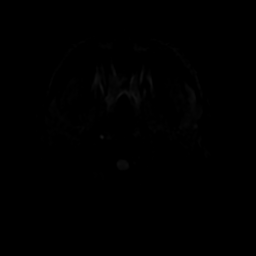
[im 17/102]
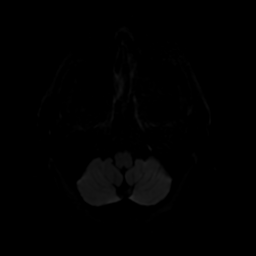
[im 34/102]
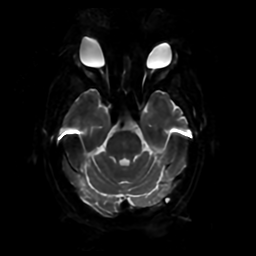
[im 51/102]
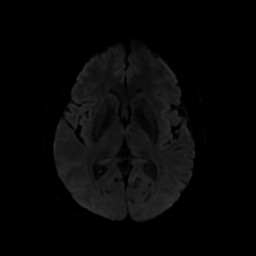
[im 68/102]
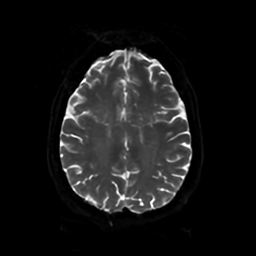
[im 85/102]
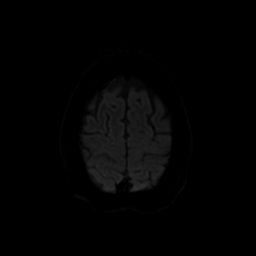
[im 102/102]
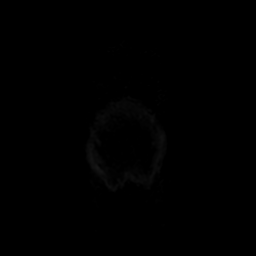

[Series 6: DWI · coronal · 4.0mm · 0.94mm/px · 5 of 74 slices shown (2 of 2)]
[im 1/74]
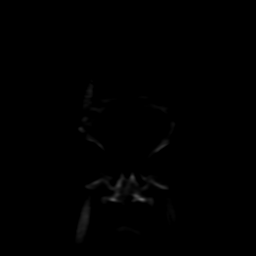
[im 19/74]
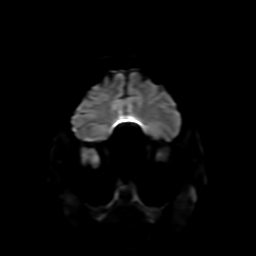
[im 37/74]
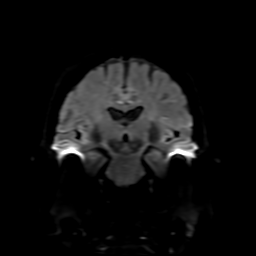
[im 55/74]
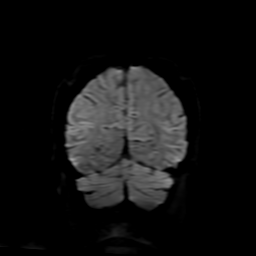
[im 74/74]
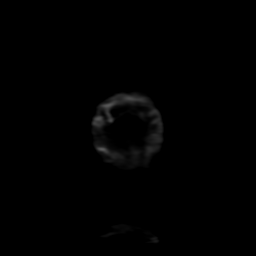

[Series 7: FLAIR · axial · 4.0mm · 0.45mm/px · z∈[-92,+56]mm · 3 of 35 slices shown (1 of 2)]
[im 1/35]
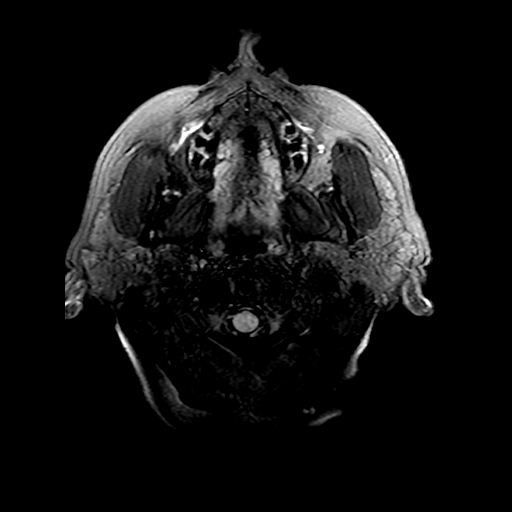
[im 18/35]
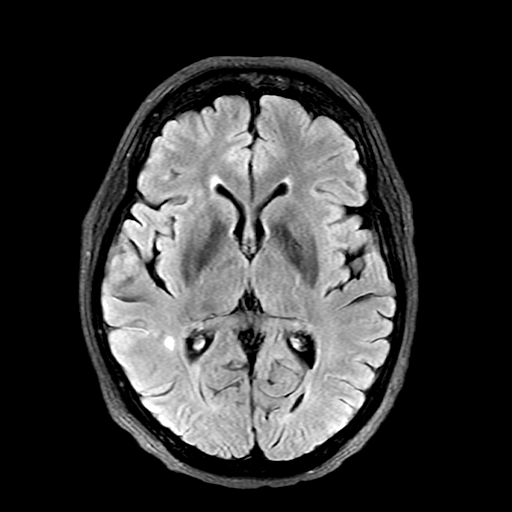
[im 35/35]
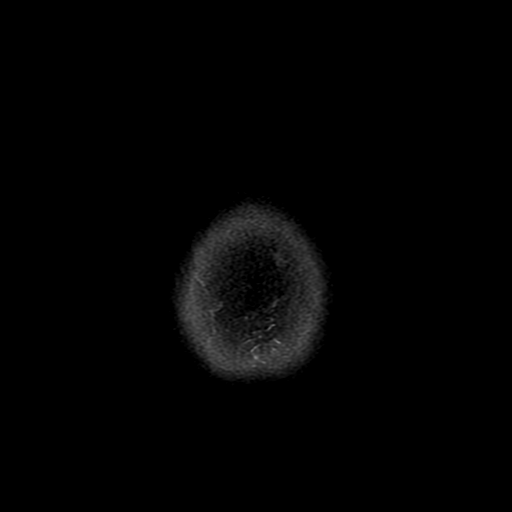

[Series 10: FLAIR · sagittal · 5.0mm · 0.47mm/px · 2 of 25 slices shown (2 of 2)]
[im 1/25]
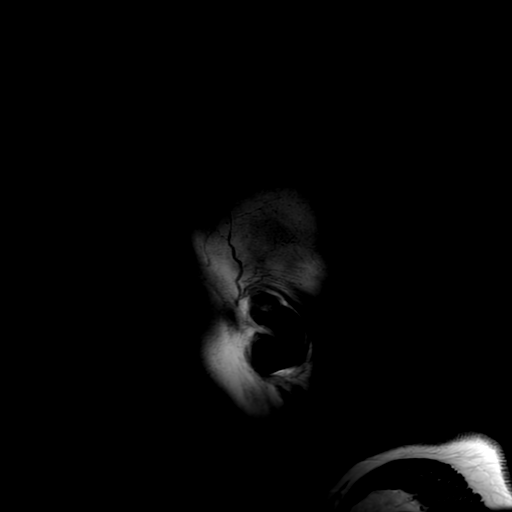
[im 25/25]
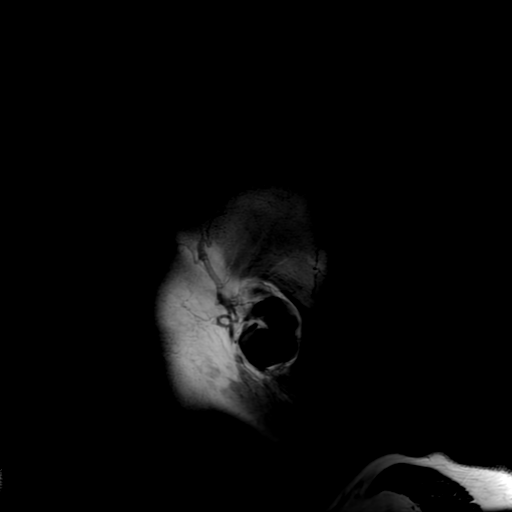

[Series 11: T2 · axial · 5.0mm · 0.47mm/px · z∈[-93,+55]mm · 2 of 26 slices shown (1 of 2)]
[im 1/26]
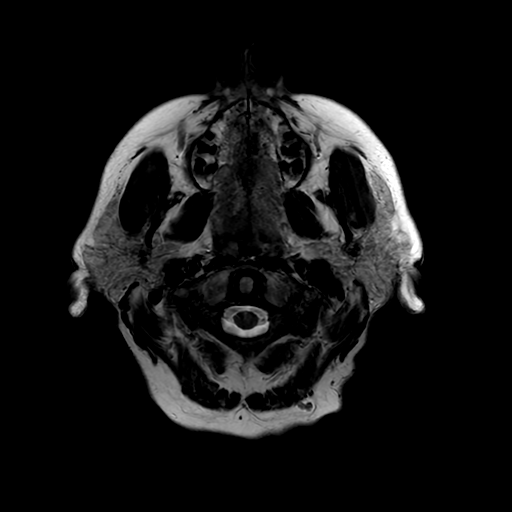
[im 26/26]
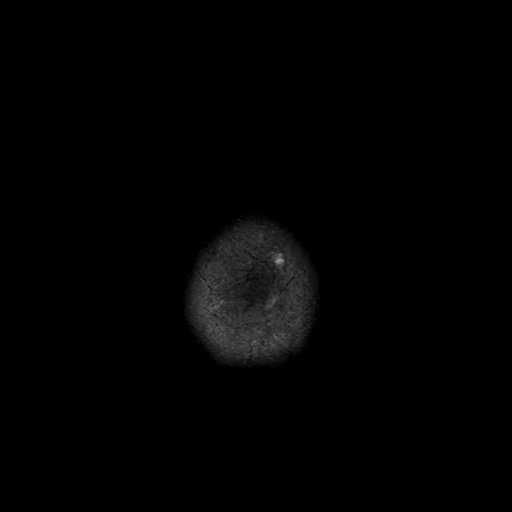

[Series 13: T2 · coronal · 5.0mm · 0.39mm/px · 2 of 31 slices shown (2 of 2)]
[im 1/31]
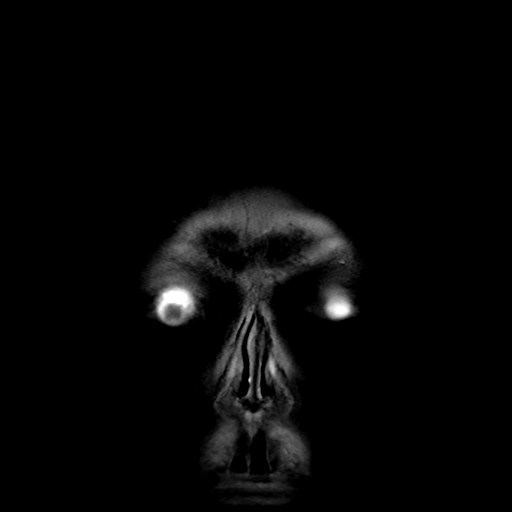
[im 31/31]
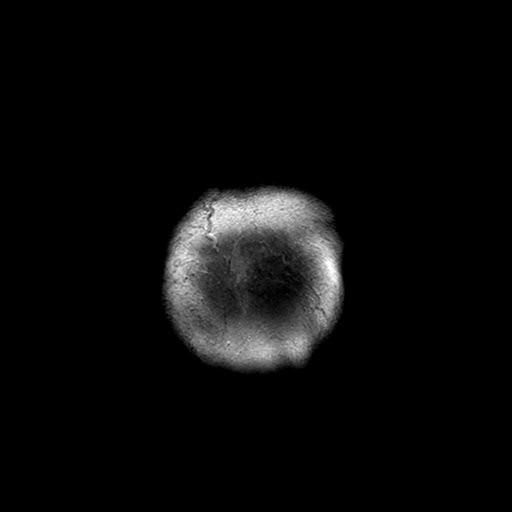

[Series 550: ADC · axial · 3.0mm · 0.94mm/px · z∈[-93,+55]mm · 4 of 50 slices shown (1 of 2)]
[im 1/50]
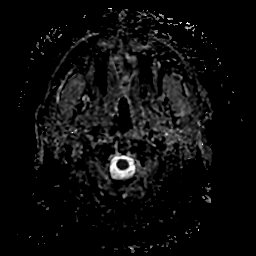
[im 17/50]
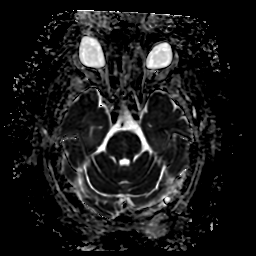
[im 33/50]
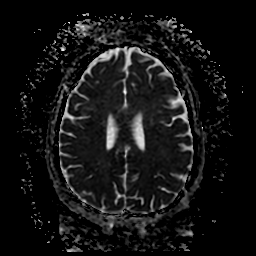
[im 50/50]
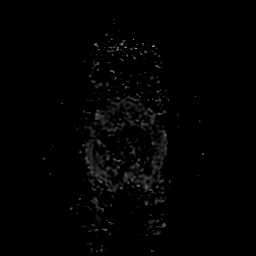

[Series 650: ADC · coronal · 4.0mm · 0.94mm/px · 3 of 35 slices shown (2 of 2)]
[im 1/35]
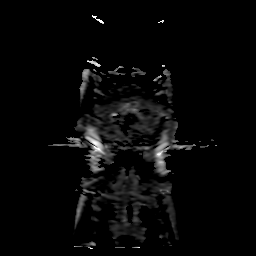
[im 18/35]
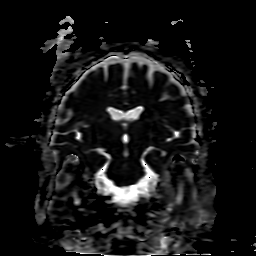
[im 35/35]
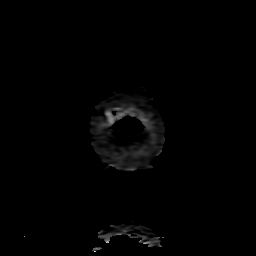

[28 of 48 positions shown; findings below may reference images not displayed]

FINDINGS: MRI HEAD FINDINGS

Brain: Cerebral volume within normal limits for age. Scattered
patchy T2/FLAIR hyperintensity seen within the periventricular and
deep white matter both cerebral hemispheres as well as the pons,
nonspecific, but most likely related chronic microvascular ischemic
disease, mild in nature.

No abnormal foci of restricted diffusion to suggest acute or
subacute ischemia. Gray-white matter differentiation maintained. No
encephalomalacia to suggest chronic cortical infarction. No evidence
for acute or chronic intracranial hemorrhage.

No mass lesion, midline shift or mass effect. No hydrocephalus or
extra-axial fluid collection. Pituitary gland suprasellar region
within normal limits. Midline structures intact and normal.

Vascular: Major intracranial vascular flow voids are maintained.

Skull and upper cervical spine: Craniocervical junction within
normal limits. Bone marrow signal intensity normal. No scalp soft
tissue abnormality.

Sinuses/Orbits: Globes and orbital soft tissues within normal
limits. Paranasal sinuses are clear. Trace left mastoid effusion
noted, of doubtful significance. Inner ear structures grossly
normal.

Other: None.

MRA HEAD FINDINGS

Anterior circulation: Visualized distal cervical segments of the
internal carotid arteries are patent with antegrade flow. Petrous,
cavernous, and supraclinoid segments patent without stenosis. 4 mm
outpouching extending laterally from the cavernous segment of the
right ICA consistent with an aneurysm (series 4, image 77). A1
segments patent bilaterally. Left A1 partially
fenestrated/duplicated. Normal anterior communicating artery
complex. Anterior cerebral arteries patent to their distal aspects
without stenosis. No M1 stenosis or occlusion. Normal MCA
bifurcations. Distal MCA branches well perfused and symmetric.

Posterior circulation: Left vertebral artery dominant and widely
patent to the vertebrobasilar junction. Right vertebral artery
hypoplastic and largely terminates in PICA, although a tiny branch
ascending towards the vertebrobasilar junction on time-of-flight
sequence. Both PICA origins patent and normal. Basilar patent to its
distal aspect without stenosis. Superior cerebral arteries patent
bilaterally. PCA supplied via the basilar as well as robust
bilateral posterior communicating arteries. PCAs well perfused to
their distal aspects without stenosis.

Anatomic variants: Hypoplastic right vertebral artery largely
terminates in PICA.

MRA NECK FINDINGS

Aortic arch: Examination degraded by motion artifact.

Visualized aortic arch normal caliber with normal 3 vessel
morphology. No hemodynamically significant stenosis seen about the
origin of the great vessels.

Right carotid system: Right CCA patent from its origin to the
bifurcation without stenosis. No significant atheromatous narrowing
about the right carotid bulb. Focal tortuosity with apparent
irregularity involving the proximal right ICA noted, favored to be
artifactual due to motion artifact through this region (series [KM],
image 8). The right ICA appears to be patent without significant
stenosis at this level on corresponding time-of-flight sequence.
Right ICA patent distally to the skull base without stenosis,
evidence for dissection, or occlusion.

Left carotid system: Left CCA patent from its origin to the
bifurcation without stenosis. Left carotid bifurcation somewhat low
lying in the neck. No significant atheromatous narrowing about the
left carotid bulb. Left ICA patent distally without stenosis,
evidence for dissection or occlusion.

Vertebral arteries: Both vertebral arteries arise from the
subclavian arteries. No proximal subclavian artery stenosis. Left
vertebral artery dominant. Possible short-segment moderate stenosis
versus artifact noted involving the pre foraminal left V1 segment
(series [KM], image 7). Vertebral arteries otherwise patent within
the neck without stenosis, evidence for dissection or occlusion.

Other: Note made of a possible additional moderate to severe
stenosis involving the distal left subclavian artery (series [KM],
image 12). Again, there is some motion artifact through this region,
limiting the certainty of this finding.
IMPRESSION: MRI HEAD:

1. No acute intracranial infarct or other abnormality.
2. Mild chronic microvascular ischemic disease for age.

MRA HEAD:

1. Negative intracranial MRA for large vessel occlusion. No
hemodynamically significant or correctable stenosis.
2. 4 mm cavernous right ICA aneurysm.

MRA NECK:

1. Motion degraded exam.
2. Focal tortuosity with apparent irregularity involving the
proximal right ICA, favored to be artifactual due to motion artifact
through this region. No other hemodynamically significant stenosis
about either carotid artery system.
3. Possible short-segment moderate stenosis versus artifact
involving the pre foraminal left V1 segment. Vertebral arteries
otherwise patent within the neck.
4. Possible additional moderate to severe stenosis involving the
distal left subclavian artery as above.

## 2021-01-25 MED ORDER — FLUTICASONE PROPIONATE 50 MCG/ACT NA SUSP
1.0000 | Freq: Every day | NASAL | Status: DC
  Administered 2021-01-25: 1 via NASAL
  Filled 2021-01-25: qty 16

## 2021-01-25 MED ORDER — INSULIN ASPART 100 UNIT/ML IJ SOLN
0.0000 [IU] | Freq: Three times a day (TID) | INTRAMUSCULAR | Status: DC
Start: 2021-01-26 — End: 2021-01-26

## 2021-01-25 MED ORDER — ROSUVASTATIN CALCIUM 20 MG PO TABS
40.0000 mg | ORAL_TABLET | Freq: Every day | ORAL | Status: DC
  Administered 2021-01-25 – 2021-01-26 (×2): 40 mg via ORAL
  Filled 2021-01-25 (×3): qty 2

## 2021-01-25 MED ORDER — POLYETHYLENE GLYCOL 3350 17 G PO PACK: 17.0000 g | PACK | Freq: Every day | ORAL | Status: DC | PRN

## 2021-01-25 MED ORDER — ACETAMINOPHEN 325 MG PO TABS: 650.0000 mg | ORAL_TABLET | Freq: Four times a day (QID) | ORAL | Status: DC | PRN

## 2021-01-25 MED ORDER — IPRATROPIUM-ALBUTEROL 0.5-2.5 (3) MG/3ML IN SOLN: 3.0000 mL | Freq: Four times a day (QID) | RESPIRATORY_TRACT | Status: DC | PRN

## 2021-01-25 MED ORDER — ENOXAPARIN SODIUM 40 MG/0.4ML IJ SOSY
40.0000 mg | PREFILLED_SYRINGE | INTRAMUSCULAR | Status: DC
  Filled 2021-01-25: qty 0.4

## 2021-01-25 MED ORDER — GADOBUTROL 1 MMOL/ML IV SOLN
9.0000 mL | Freq: Once | INTRAVENOUS | Status: AC | PRN
  Administered 2021-01-25: 9 mL via INTRAVENOUS

## 2021-01-25 MED ORDER — INSULIN ASPART 100 UNIT/ML IJ SOLN
0.0000 [IU] | Freq: Every day | INTRAMUSCULAR | Status: DC
Start: 2021-01-25 — End: 2021-01-26

## 2021-01-25 MED ORDER — LACTATED RINGERS IV SOLN: INTRAVENOUS | Status: DC

## 2021-01-25 MED ORDER — PANTOPRAZOLE SODIUM 40 MG PO TBEC: 40.0000 mg | DELAYED_RELEASE_TABLET | Freq: Once | ORAL | Status: DC | PRN

## 2021-01-25 MED ORDER — MELATONIN 3 MG PO TABS
3.0000 mg | ORAL_TABLET | Freq: Every evening | ORAL | Status: DC | PRN
  Filled 2021-01-25: qty 1

## 2021-01-25 MED ORDER — ASPIRIN 81 MG PO CHEW
81.0000 mg | CHEWABLE_TABLET | Freq: Every day | ORAL | Status: DC
  Administered 2021-01-25 – 2021-01-26 (×2): 81 mg via ORAL
  Filled 2021-01-25 (×3): qty 1

## 2021-01-25 MED ORDER — ONDANSETRON HCL 4 MG/2ML IJ SOLN: 4.0000 mg | Freq: Four times a day (QID) | INTRAMUSCULAR | Status: DC | PRN

## 2021-01-25 MED ORDER — ENOXAPARIN SODIUM 40 MG/0.4ML IJ SOSY
40.0000 mg | PREFILLED_SYRINGE | INTRAMUSCULAR | Status: DC
  Administered 2021-01-25: 40 mg via SUBCUTANEOUS
  Filled 2021-01-25: qty 0.4

## 2021-01-25 MED ORDER — VALACYCLOVIR HCL 500 MG PO TABS
1000.0000 mg | ORAL_TABLET | Freq: Two times a day (BID) | ORAL | Status: DC
  Filled 2021-01-25 (×2): qty 2

## 2021-01-25 NOTE — Progress Notes (Signed)
[facility] night shift.  The patient and asked the nursing staff about her antihypertensive medications.  I have asked the staff to tell her that given her symptoms we are being permissive with her hypertension up to SBP of 220 or DBP of 120 mmHg.    Later in the shift, the patient also had transient dyspnea due to postnasal drip with mild wheezing.  DuoNeb as needed ordered.  However, her nasal symptoms cleared up after she use the nasal fluticasone and nebulized bronchodilators were not needed.  Sanda Klein, MD.

## 2021-01-25 NOTE — ED Notes (Addendum)
Patient transported to MRI 

## 2021-01-25 NOTE — ED Notes (Signed)
Patient is up to the bathroom at this time. UA cup given.

## 2021-01-25 NOTE — ED Triage Notes (Signed)
Sudden visions loss this morning about 10am this morning cannot give exact time. Was seen at the ophthalmologist today.

## 2021-01-25 NOTE — ED Notes (Signed)
Paged neuro per Dr. Anne Ng request

## 2021-01-25 NOTE — ED Notes (Signed)
RN messaged pharmacy for verification of [phone] medications

## 2021-01-25 NOTE — H&P (Addendum)
History and Physical  Sandy Ward YIA:[id number] DOB: October 15, 1955 DOA: 01/25/2021  Referring physician: Dr. Stevie Kern, EDP. PCP: Medicine, [facility]  Outpatient Specialists: Ophthalmology Patient coming from: Home.   Chief Complaint: Possible left-sided visual loss.  HPI: Sandy Ward is a 65 y.o. female with medical history significant for obesity, type 2 diabetes, essential hypertension, hyperlipidemia, ongoing tobacco user, who presented to [facility] ED at the recommendation of her ophthalmologist due to lateral left-eye painless visual field defect which she noted after waking up this morning around 6:30 AM.  She was in her usual state of health prior to this.  She denies having any palpitations or dizziness or headaches.  She reports a family history of stroke in her father at the age of 46.  She describes the visual field defect as black spots on her left-lateral eyesight when she moves her eyes a certain way, it is painless.  She thought the spots will go away but persisted so she decided to go see her ophthalmologist.  Her eye exam revealed possible blood clot with retinal artery occlusion.  She was sent to the ED for further evaluation and management and to rule out a stroke.  She drove herself to the ED.  EDP contacted neurology who recommended hospitalist admission for stroke work-up.  ED Course:  Temperature 98.2.  BP 124/71.  Pulse 71, respiration rate 17, O2 saturation 96% on room air.  Lab studies remarkable for serum glucose 118.  WBC 11.2.  Rest of labs essentially unremarkable.  Review of Systems: Review of systems as noted in the HPI. All other systems reviewed and are negative.   Past Medical History:  Diagnosis Date   Diabetes mellitus without complication (HCC)    Hypertension    History reviewed. No pertinent surgical history.  Social History:  Ongoing tobacco user since the age of 74.  Currently smokes 1 pack/day.  No Known Allergies  Family  history: Father with history of CVA at the age of 58.  Prior to Admission medications   Medication Sig Start Date End Date Taking? Authorizing Provider  aspirin 81 MG chewable tablet Chew 81 mg by mouth daily.   Yes [provider]  fluticasone (FLONASE) 50 MCG/ACT nasal spray Place 1 spray into both nostrils daily. 12/28/20  Yes [provider]  hydrochlorothiazide (HYDRODIURIL) 25 MG tablet Take 25 mg by mouth daily. 01/18/21  Yes [provider]  losartan (COZAAR) 50 MG tablet Take 50 mg by mouth daily. 01/18/21  Yes [provider]  metFORMIN (GLUCOPHAGE) 500 MG tablet Take 500 mg by mouth daily. 10/30/20  Yes [provider]  rosuvastatin (CRESTOR) 20 MG tablet Take 20 mg by mouth at bedtime. 05/24/20  Yes [provider]  triamcinolone cream (KENALOG) 0.1 % Apply 1 application topically 2 (two) times daily as needed for skin breakdown. 12/29/20  Yes [provider]  valACYclovir (VALTREX) 1000 MG tablet Take 1,000 mg by mouth 2 (two) times daily. 11/15/20  Yes [provider]    Physical Exam: BP 124/71   Pulse 66   Temp 98.2 F (36.8 C) (Oral)   Resp 17   SpO2 96%   General: 65 y.o. year-old female well developed well nourished in no acute distress.  Alert and oriented x3. Cardiovascular: Regular rate and rhythm with no rubs or gallops.  Grade 4 out of 6 systolic murmur.  No thyromegaly or JVD noted.  1+ pitting edema in lower extremities bilaterally.   Respiratory: Clear to auscultation  with no wheezes or rales. Good inspiratory effort. Abdomen: Soft nontender nondistended with normal bowel sounds x4 quadrants. Muskuloskeletal: No cyanosis or clubbing.  1+ pitting edema in lower extremities bilaterally. Neuro: CN II-XII intact, strength, sensation, reflexes Skin: No ulcerative lesions noted or rashes Psychiatry: Judgement and insight appear normal. Mood is appropriate for condition and setting          Labs on  Admission:  Basic Metabolic Panel: Recent Labs  Lab 01/25/21 1751  NA 138  K 3.9  CL 102  CO2 28  GLUCOSE 118*  BUN 19  CREATININE 0.87  CALCIUM 9.1   Liver Function Tests: Recent Labs  Lab 01/25/21 1751  AST 17  ALT 18  ALKPHOS 72  BILITOT 0.8  PROT 7.6  ALBUMIN 3.9   No results for input(s): LIPASE, AMYLASE in the last 168 hours. No results for input(s): AMMONIA in the last 168 hours. CBC: Recent Labs  Lab 01/25/21 1751  WBC 11.2*  NEUTROABS 7.5  HGB 14.0  HCT 42.1  MCV 86.3  PLT 273   Cardiac Enzymes: No results for input(s): CKTOTAL, CKMB, CKMBINDEX, TROPONINI in the last 168 hours.  BNP (last 3 results) No results for input(s): BNP in the last 8760 hours.  ProBNP (last 3 results) No results for input(s): PROBNP in the last 8760 hours.  CBG: Recent Labs  Lab 01/25/21 1751  GLUCAP 112*    Radiological Exams on Admission: No results found.  EKG: I independently viewed the EKG done and my findings are as followed: Sinus rhythm rate of 79.  QTc 434.  Nonspecific ST-T changes.  Assessment/Plan Present on Admission: **None**  Active Problems:   Visual loss  Acute left eye partial painless visual loss. She noted after waking up this morning around 6:30 AM. Eye exam by ophthalmology with concern for possible retinal artery occlusion. Neurology recommended hospitalist admission for stroke work-up. Follow MRA head without contrast, MRA neck with or without contrast Follow MRI brain without contrast. Obtain 2D echo with bubble study. Obtain fasting lipid panel, hemoglobin A1c. PT OT speech evaluation. Frequent neuro checks Start aspirin 81 mg daily and Crestor 40 mg daily. Neurology has been consulted by EDP  Grade 4 out of 6 systolic murmur Incidental finding, patient was not aware. She denies any cardiopulmonary symptoms at the time of this visit. Follow 2D echo with bubble study.  Type 2 diabetes with hyperglycemia Obtain hemoglobin  A1c, goal A1c less than 7.0. Obtain fasting lipid panel, goal LDL less than 70 Hold off home oral hypoglycemics Start insulin sliding scale.  Hyperlipidemia She is on Crestor 20 mg daily at home Follow fasting lipid panel. Increased dose of Crestor to 40 mg daily  Essential hypertension BP is currently stable Ongoing permissive hypertension Treat SBP greater than 220 or DBP greater than 120. Closely monitor vital signs.  Tobacco use disorder Self reports she smokes 1 pack/day, started smoking at the age of 22. Tobacco cessation counseling done at bedside, patient is receptive. She has tried Chantix in the past but it gave her depressive symptoms, she is willing to try another method of tobacco cessation   DVT prophylaxis: Subcu Lovenox daily.  Code Status: Full code.  Family Communication: None at bedside  Disposition Plan: Admit to telemetry medical.  Consults called: Neurology consulted by EDP.  Admission status: Observation status.   Status is: Observation    Dispo:  Patient From: Home  Planned Disposition: Home possibly on 01/26/2021 or when neurology signs off.  Medically  stable for discharge: No      Darlin Drop MD [facility] Pager [phone]  If 7PM-7AM, please contact night-coverage [email or web address] Password [facility]  01/25/2021, 8:08 PM

## 2021-01-26 ENCOUNTER — Other Ambulatory Visit: Payer: Self-pay | Admitting: Physician Assistant

## 2021-01-26 ENCOUNTER — Other Ambulatory Visit: Payer: Self-pay

## 2021-01-26 ENCOUNTER — Observation Stay (HOSPITAL_COMMUNITY): Payer: BC Managed Care – PPO

## 2021-01-26 ENCOUNTER — Observation Stay (HOSPITAL_BASED_OUTPATIENT_CLINIC_OR_DEPARTMENT_OTHER): Payer: BC Managed Care – PPO

## 2021-01-26 ENCOUNTER — Other Ambulatory Visit: Payer: Self-pay | Admitting: Neurology

## 2021-01-26 DIAGNOSIS — I350 Nonrheumatic aortic (valve) stenosis: Secondary | ICD-10-CM | POA: Diagnosis not present

## 2021-01-26 DIAGNOSIS — H34219 Partial retinal artery occlusion, unspecified eye: Secondary | ICD-10-CM

## 2021-01-26 DIAGNOSIS — H34232 Retinal artery branch occlusion, left eye: Secondary | ICD-10-CM | POA: Diagnosis not present

## 2021-01-26 DIAGNOSIS — I361 Nonrheumatic tricuspid (valve) insufficiency: Secondary | ICD-10-CM

## 2021-01-26 DIAGNOSIS — H547 Unspecified visual loss: Secondary | ICD-10-CM | POA: Diagnosis not present

## 2021-01-26 DIAGNOSIS — I671 Cerebral aneurysm, nonruptured: Secondary | ICD-10-CM

## 2021-01-26 LAB — HEMOGLOBIN A1C
Hgb A1c MFr Bld: 6 % — ABNORMAL HIGH (ref 4.8–5.6)
Mean Plasma Glucose: 125.5 mg/dL

## 2021-01-26 LAB — BASIC METABOLIC PANEL
Anion gap: 6 (ref 5–15)
BUN: 19 mg/dL (ref 8–23)
CO2: 28 mmol/L (ref 22–32)
Calcium: 8.9 mg/dL (ref 8.9–10.3)
Chloride: 105 mmol/L (ref 98–111)
Creatinine, Ser: 0.69 mg/dL (ref 0.44–1.00)
GFR, Estimated: >60 mL/min (ref >60)
Glucose, Bld: 111 mg/dL — ABNORMAL HIGH (ref 70–99)
Potassium: 3.4 mmol/L — ABNORMAL LOW (ref 3.5–5.1)
Sodium: 139 mmol/L (ref 135–145)

## 2021-01-26 LAB — GLUCOSE, CAPILLARY
Glucose-Capillary: 107 mg/dL — ABNORMAL HIGH (ref 70–99)
Glucose-Capillary: 77 mg/dL (ref 70–99)

## 2021-01-26 LAB — ECHOCARDIOGRAM COMPLETE BUBBLE STUDY
AR max vel: 0.99 cm2
AV Area VTI: 1 cm2
AV Area mean vel: 0.97 cm2
AV Mean grad: 25 mmHg
AV Peak grad: 42.3 mmHg
Ao pk vel: 3.25 m/s
Area-P 1/2: 4.39 cm2
S' Lateral: 3.4 cm

## 2021-01-26 LAB — LIPID PANEL
Cholesterol: 200 mg/dL (ref 0–200)
HDL: 27 mg/dL — ABNORMAL LOW (ref >40)
LDL Cholesterol: 134 mg/dL — ABNORMAL HIGH (ref 0–99)
Total CHOL/HDL Ratio: 7.4 RATIO
Triglycerides: 196 mg/dL — ABNORMAL HIGH (ref <150)
VLDL: 39 mg/dL (ref 0–40)

## 2021-01-26 LAB — CBC
HCT: 37.4 % (ref 36.0–46.0)
Hemoglobin: 12.5 g/dL (ref 12.0–15.0)
MCH: 28.7 pg (ref 26.0–34.0)
MCHC: 33.4 g/dL (ref 30.0–36.0)
MCV: 86 fL (ref 80.0–100.0)
Platelets: 234 10*3/uL (ref 150–400)
RBC: 4.35 MIL/uL (ref 3.87–5.11)
RDW: 14.9 % (ref 11.5–15.5)
WBC: 8.5 10*3/uL (ref 4.0–10.5)
nRBC: 0 % (ref 0.0–0.2)

## 2021-01-26 LAB — HIV ANTIBODY (ROUTINE TESTING W REFLEX): HIV Screen 4th Generation wRfx: NONREACTIVE

## 2021-01-26 LAB — C-REACTIVE PROTEIN: CRP: 0.7 mg/dL (ref <1.0)

## 2021-01-26 LAB — MAGNESIUM: Magnesium: 2 mg/dL (ref 1.7–2.4)

## 2021-01-26 LAB — SEDIMENTATION RATE: Sed Rate: 17 mm/hr (ref 0–22)

## 2021-01-26 LAB — SARS CORONAVIRUS 2 (TAT 6-24 HRS): SARS Coronavirus 2: NEGATIVE

## 2021-01-26 LAB — PHOSPHORUS: Phosphorus: 4.5 mg/dL (ref 2.5–4.6)

## 2021-01-26 IMAGING — CT CT ANGIO HEAD-NECK (W OR W/O PERF)
1 of 11 series · 5 of 33 positions shown · IV contrast (APPLIED)
Comparison: MRI from yesterday.

CLINICAL DATA: Stroke follow-up.

EXAM:
CT ANGIOGRAPHY HEAD AND NECK
TECHNIQUE: Multidetector CT imaging of the head and neck was performed using
the standard protocol during bolus administration of intravenous
contrast. Multiplanar CT image reconstructions and MIPs were
obtained to evaluate the vascular anatomy. Carotid stenosis
measurements (when applicable) are obtained utilizing NASCET
criteria, using the distal internal carotid diameter as the
denominator.
CONTRAST:  100mL OMNIPAQUE IOHEXOL 350 MG/ML SOLN

[Series 11: ax thins · axial · 0.39mm/px · z∈[-3,+216]mm · 5 of 340 slices shown]
[im 57/340  soft-tissue]
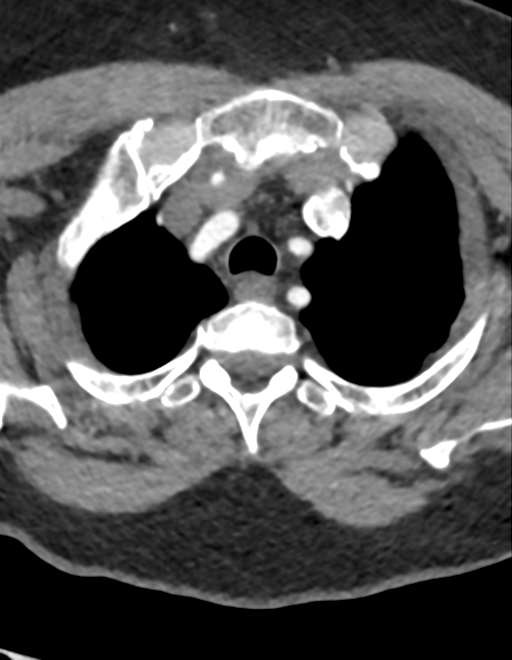
[im 114/340  bone]
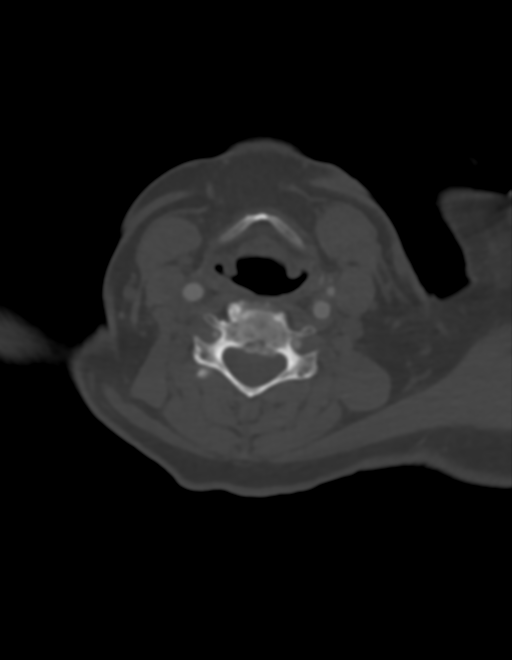
[im 170/340  soft-tissue]
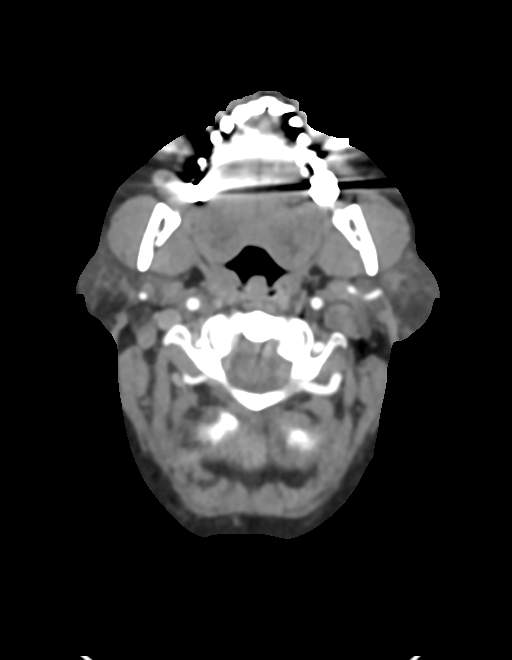
[im 227/340  bone]
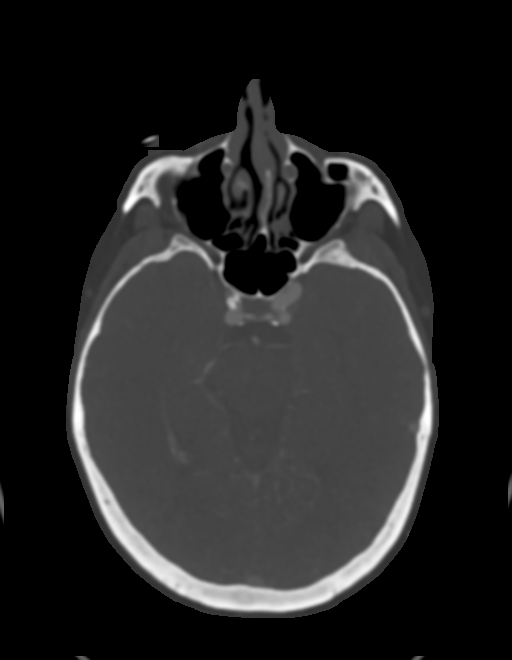
[im 283/340  soft-tissue]
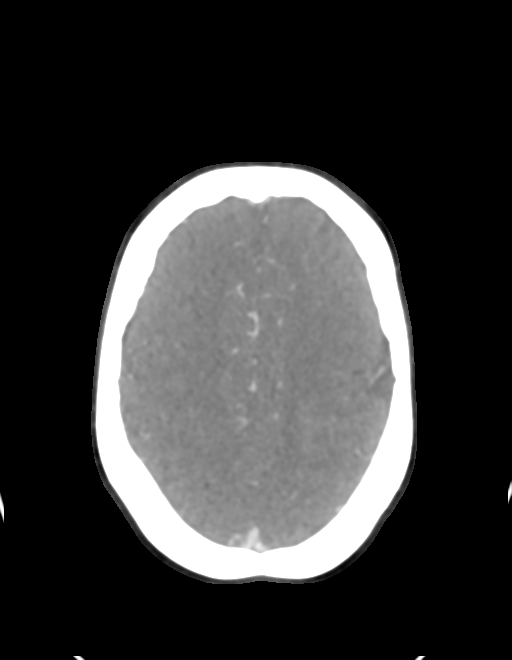

[5 of 33 positions shown; findings below may reference images not displayed]

FINDINGS: CT HEAD FINDINGS

Brain: No evidence of acute large vascular territory infarction,
hemorrhage, hydrocephalus, extra-axial collection or mass
lesion/mass effect.

Vascular: See below

Skull: No acute fracture.

Sinuses/Orbits: Clear sinuses.  Unremarkable orbits.

Other: No mastoid effusions.

CTA NECK FINDINGS

Aortic arch: Patent great vessel origins.  Atherosclerosis.

Right carotid system: Moderate predominately calcific
atherosclerosis at the carotid bifurcation without greater than 50%
narrowing.

Left carotid system: Mixed calcific and noncalcific atherosclerosis
at the carotid bifurcation without greater than 50% stenosis.

Vertebral arteries: Left dominant no significant (greater than 50%)
stenosis. The non dominant right vertebral artery is small
throughout its course.

Skeleton: Moderate multilevel degenerative disc disease and facet
arthropathy.

Other neck: Prominent tonsillar tissue bilaterally without evidence
of acute abnormality.

Upper chest: Visualized lung apices are clear.

Review of the MIP images confirms the above findings

CTA HEAD FINDINGS

Anterior circulation: Laterally directed 4 mm right proximal
cavernous ICA aneurysm. Bilateral cavernous and paraclinoid calcific
atherosclerosis without greater than 50% narrowing. Bilateral MCAs
and ACAs are patent without proximal hemodynamically significant
stenosis.

Posterior circulation: Small vertebrobasilar system with prominent
bilateral posterior communicating arteries, anatomic variant.
Bilateral intradural vertebral arteries, basilar artery and PCAs are
patent without proximal hemodynamically significant stenosis. No
aneurysm identified.

Venous sinuses: As permitted by contrast timing, patent.

Review of the MIP images confirms the above findings
IMPRESSION: CT head:

No evidence of acute intracranial abnormality.

CTA head:

1. No large vessel occlusion or hemodynamically significant proximal
stenosis.
2. Laterally directed 4 mm right cavernous ICA aneurysm.

CTA neck:

Moderate bilateral carotid bifurcation atherosclerosis without
greater than 50% stenosis.

## 2021-01-26 MED ORDER — POTASSIUM CHLORIDE CRYS ER 20 MEQ PO TBCR
40.0000 meq | EXTENDED_RELEASE_TABLET | Freq: Once | ORAL | Status: AC
  Administered 2021-01-26: 40 meq via ORAL
  Filled 2021-01-26: qty 2

## 2021-01-26 MED ORDER — IOHEXOL 350 MG/ML SOLN
100.0000 mL | Freq: Once | INTRAVENOUS | Status: AC | PRN
  Administered 2021-01-26: 100 mL via INTRAVENOUS

## 2021-01-26 MED ORDER — CLOPIDOGREL BISULFATE 75 MG PO TABS: 75.0000 mg | ORAL_TABLET | Freq: Every day | ORAL | 0 refills | Status: DC

## 2021-01-26 MED ORDER — CLOPIDOGREL BISULFATE 75 MG PO TABS: 75.0000 mg | ORAL_TABLET | Freq: Every day | ORAL | 0 refills | Status: AC

## 2021-01-26 MED ORDER — ROSUVASTATIN CALCIUM 40 MG PO TABS: 40.0000 mg | ORAL_TABLET | Freq: Every day | ORAL | 0 refills | Status: DC

## 2021-01-26 MED ORDER — ASPIRIN 81 MG PO CHEW: 81.0000 mg | CHEWABLE_TABLET | Freq: Every day | ORAL | 0 refills | Status: AC

## 2021-01-26 MED ORDER — CLOPIDOGREL BISULFATE 75 MG PO TABS
75.0000 mg | ORAL_TABLET | Freq: Every day | ORAL | Status: DC
  Administered 2021-01-26: 75 mg via ORAL
  Filled 2021-01-26: qty 1

## 2021-01-26 NOTE — Progress Notes (Signed)
PT Cancellation Note  Patient Details Name: Florabelle Cardin MRN: [id number] DOB: 11-29-1955   Cancelled Treatment:    Reason Eval/Treat Not Completed: Patient at procedure or test/unavailable Currently getting ECHO. Will attempt to return as time/schedule allow.   Madelaine Etienne, DPT, PN1   Supplemental Physical Therapist [facility]    Pager [phone] Acute Rehab Office [phone]

## 2021-01-26 NOTE — ED Provider Notes (Addendum)
[facility] Provider Note   CSN: [id number] Arrival date & time: 01/25/21  1735     History Chief Complaint  Patient presents with   Loss of Vision    Sandy Ward is a 65 y.o. female.  Presents to ER with concern for abnormal vision.  Patient states that when she went to bed last night she did not have any visual changes.  When she woke up this morning around 10 AM she noted black spots in her left eye.  No change in vision in her right eye.  Went to her ophthalmologist and was told that she has a clot in her eye and sent to ER.  Denies any numbness, weakness, speech changes.  Denies prior history of stroke.  Non-smoker.  Discussed case with ophthalmology, concern for branch retinal artery occlusion, recommends admission for stroke/TIA work-up and neurology consultation.  HPI     Past Medical History:  Diagnosis Date   Diabetes mellitus without complication (HCC)    Hypertension     Patient Active Problem List   Diagnosis Date Noted   Visual loss 01/25/2021    History reviewed. No pertinent surgical history.   OB History   No obstetric history on file.     History reviewed. No pertinent family history.  Social History   Tobacco Use   Smoking status: Never   Smokeless tobacco: Never    Home Medications Prior to Admission medications   Medication Sig Start Date End Date Taking? Authorizing Provider  fluticasone (FLONASE) 50 MCG/ACT nasal spray Place 1 spray into both nostrils daily. 12/28/20  Yes [provider]  hydrochlorothiazide (HYDRODIURIL) 25 MG tablet Take 25 mg by mouth daily. 01/18/21  Yes [provider]  losartan (COZAAR) 50 MG tablet Take 50 mg by mouth daily. 01/18/21  Yes [provider]  metFORMIN (GLUCOPHAGE) 500 MG tablet Take 500 mg by mouth daily. 10/30/20  Yes [provider]  triamcinolone cream (KENALOG) 0.1 % Apply 1 application topically 2 (two) times daily as needed for skin breakdown. 12/29/20  Yes  [provider]  valACYclovir (VALTREX) 1000 MG tablet Take 1,000 mg by mouth 2 (two) times daily. 11/15/20  Yes [provider]  aspirin 81 MG chewable tablet Chew 1 tablet (81 mg total) by mouth daily for 21 days. 01/26/21 02/16/21  Verdia Kuba, DO  clopidogrel (PLAVIX) 75 MG tablet Take 1 tablet (75 mg total) by mouth daily. 01/27/21 02/26/21  Verdia Kuba, DO  rosuvastatin (CRESTOR) 40 MG tablet Take 1 tablet (40 mg total) by mouth daily. 01/27/21   Verdia Kuba, DO    Allergies    Patient has no known allergies.  Review of Systems   Review of Systems  Constitutional:  Negative for chills and fever.  HENT:  Negative for ear pain and sore throat.   Eyes:  Positive for visual disturbance. Negative for pain.  Respiratory:  Negative for cough and shortness of breath.   Cardiovascular:  Negative for chest pain and palpitations.  Gastrointestinal:  Negative for abdominal pain and vomiting.  Genitourinary:  Negative for dysuria and hematuria.  Musculoskeletal:  Negative for arthralgias and back pain.  Skin:  Negative for color change and rash.  Neurological:  Negative for seizures and syncope.  All other systems reviewed and are negative.  Physical Exam Updated Vital Signs BP (!) 171/67 (BP Location: Right Arm)   Pulse 72   Temp 98.4 F (36.9 C) (Oral)   Resp 16   Ht   (1.651 m)   Wt 97.4 kg   SpO2 100%   BMI 35.73 kg/m   Physical Exam Vitals and nursing note reviewed.  Constitutional:      General: She is not in acute distress.    Appearance: She is well-developed.  HENT:     Head: Normocephalic and atraumatic.  Eyes:     Conjunctiva/sclera: Conjunctivae normal.  Cardiovascular:     Rate and Rhythm: Normal rate and regular rhythm.     Heart sounds: No murmur heard. Pulmonary:     Effort: Pulmonary effort is normal. No respiratory distress.     Breath sounds: Normal breath sounds.  Abdominal:     Palpations: Abdomen is soft.     Tenderness:  There is no abdominal tenderness.  Musculoskeletal:     Cervical back: Neck supple.  Skin:    General: Skin is warm and dry.  Neurological:     Mental Status: She is alert.     Comments: AAOx3 CN 2-12 intact, speech clear visual fields intact 5/5 strength in b/l UE and LE Sensation to light touch intact in b/l UE and LE Normal FNF Normal gait    ED Results / Procedures / Treatments   Labs (all labs ordered are listed, but only abnormal results are displayed) Labs Reviewed  CBC - Abnormal; Notable for the following components:      Result Value   WBC 11.2 (*)    All other components within normal limits  COMPREHENSIVE METABOLIC PANEL - Abnormal; Notable for the following components:   Glucose, Bld 118 (*)    All other components within normal limits  URINALYSIS, ROUTINE W REFLEX MICROSCOPIC - Abnormal; Notable for the following components:   APPearance HAZY (*)    Hgb urine dipstick SMALL (*)    Leukocytes,Ua SMALL (*)    Bacteria, UA RARE (*)    All other components within normal limits  BASIC METABOLIC PANEL - Abnormal; Notable for the following components:   Potassium 3.4 (*)    Glucose, Bld 111 (*)    All other components within normal limits  HEMOGLOBIN A1C - Abnormal; Notable for the following components:   Hgb A1c MFr Bld 6.0 (*)    All other components within normal limits  LIPID PANEL - Abnormal; Notable for the following components:   Triglycerides 196 (*)    HDL 27 (*)    LDL Cholesterol 134 (*)    All other components within normal limits  GLUCOSE, CAPILLARY - Abnormal; Notable for the following components:   Glucose-Capillary 107 (*)    All other components within normal limits  CBG MONITORING, ED - Abnormal; Notable for the following components:   Glucose-Capillary 112 (*)    All other components within normal limits  SARS CORONAVIRUS 2 (TAT 6-24 HRS)  ETHANOL  PROTIME-INR  APTT  DIFFERENTIAL  RAPID URINE DRUG SCREEN, HOSP PERFORMED  CBC   MAGNESIUM  PHOSPHORUS  GLUCOSE, CAPILLARY  HIV ANTIBODY (ROUTINE TESTING W REFLEX)  SEDIMENTATION RATE  C-REACTIVE PROTEIN  GLUCOSE, CAPILLARY    EKG EKG Interpretation  Date/Time:  Friday January 25 2021 17:48:03 EDT Ventricular Rate:  79 PR Interval:  166 QRS Duration: 105 QT Interval:  378 QTC Calculation: 434 R Axis:   62 Text Interpretation: Sinus rhythm Confirmed by Marianna Fuss ([id number]) on 01/26/2021 4:05:18 PM  Radiology MR ANGIO HEAD WO CONTRAST  Result Date: 01/25/2021 CLINICAL DATA:  Initial evaluation for neuro deficit, stroke, TIA. EXAM: MRI HEAD WITHOUT CONTRAST MRA  HEAD WITHOUT CONTRAST MRA NECK WITHOUT AND WITH CONTRAST TECHNIQUE: Multiplanar, multi-echo pulse sequences of the brain and surrounding structures were acquired without intravenous contrast. Angiographic images of the Circle of Willis were acquired using MRA technique without intravenous contrast. Angiographic images of the neck were acquired using MRA technique without and with intravenous contrast. Carotid stenosis measurements (when applicable) are obtained utilizing NASCET criteria, using the distal internal carotid diameter as the denominator. CONTRAST:  69mL GADAVIST GADOBUTROL 1 MMOL/ML IV SOLN COMPARISON:  None available. FINDINGS: MRI HEAD FINDINGS Brain: Cerebral volume within normal limits for age. Scattered patchy T2/FLAIR hyperintensity seen within the periventricular and deep white matter both cerebral hemispheres as well as the pons, nonspecific, but most likely related chronic microvascular ischemic disease, mild in nature. No abnormal foci of restricted diffusion to suggest acute or subacute ischemia. Gray-white matter differentiation maintained. No encephalomalacia to suggest chronic cortical infarction. No evidence for acute or chronic intracranial hemorrhage. No mass lesion, midline shift or mass effect. No hydrocephalus or extra-axial fluid collection. Pituitary gland suprasellar region within  normal limits. Midline structures intact and normal. Vascular: Major intracranial vascular flow voids are maintained. Skull and upper cervical spine: Craniocervical junction within normal limits. Bone marrow signal intensity normal. No scalp soft tissue abnormality. Sinuses/Orbits: Globes and orbital soft tissues within normal limits. Paranasal sinuses are clear. Trace left mastoid effusion noted, of doubtful significance. Inner ear structures grossly normal. Other: None. MRA HEAD FINDINGS Anterior circulation: Visualized distal cervical segments of the internal carotid arteries are patent with antegrade flow. Petrous, cavernous, and supraclinoid segments patent without stenosis. 4 mm outpouching extending laterally from the cavernous segment of the right ICA consistent with an aneurysm (series 4, image 77). A1 segments patent bilaterally. Left A1 partially fenestrated/duplicated. Normal anterior communicating artery complex. Anterior cerebral arteries patent to their distal aspects without stenosis. No M1 stenosis or occlusion. Normal MCA bifurcations. Distal MCA branches well perfused and symmetric. Posterior circulation: Left vertebral artery dominant and widely patent to the vertebrobasilar junction. Right vertebral artery hypoplastic and largely terminates in PICA, although a tiny branch ascending towards the vertebrobasilar junction on time-of-flight sequence. Both PICA origins patent and normal. Basilar patent to its distal aspect without stenosis. Superior cerebral arteries patent bilaterally. PCA supplied via the basilar as well as robust bilateral posterior communicating arteries. PCAs well perfused to their distal aspects without stenosis. Anatomic variants: Hypoplastic right vertebral artery largely terminates in PICA. MRA NECK FINDINGS Aortic arch: Examination degraded by motion artifact. Visualized aortic arch normal caliber with normal 3 vessel morphology. No hemodynamically significant stenosis seen  about the origin of the great vessels. Right carotid system: Right CCA patent from its origin to the bifurcation without stenosis. No significant atheromatous narrowing about the right carotid bulb. Focal tortuosity with apparent irregularity involving the proximal right ICA noted, favored to be artifactual due to motion artifact through this region (series 1405, image 8). The right ICA appears to be patent without significant stenosis at this level on corresponding time-of-flight sequence. Right ICA patent distally to the skull base without stenosis, evidence for dissection, or occlusion. Left carotid system: Left CCA patent from its origin to the bifurcation without stenosis. Left carotid bifurcation somewhat low lying in the neck. No significant atheromatous narrowing about the left carotid bulb. Left ICA patent distally without stenosis, evidence for dissection or occlusion. Vertebral arteries: Both vertebral arteries arise from the subclavian arteries. No proximal subclavian artery stenosis. Left vertebral artery dominant. Possible short-segment moderate stenosis versus artifact noted involving  the pre foraminal left V1 segment (series 1408, image 7). Vertebral arteries otherwise patent within the neck without stenosis, evidence for dissection or occlusion. Other: Note made of a possible additional moderate to severe stenosis involving the distal left subclavian artery (series 1405, image 12). Again, there is some motion artifact through this region, limiting the certainty of this finding. IMPRESSION: MRI HEAD: 1. No acute intracranial infarct or other abnormality. 2. Mild chronic microvascular ischemic disease for age. MRA HEAD: 1. Negative intracranial MRA for large vessel occlusion. No hemodynamically significant or correctable stenosis. 2. 4 mm cavernous right ICA aneurysm. MRA NECK: 1. Motion degraded exam. 2. Focal tortuosity with apparent irregularity involving the proximal right ICA, favored to be  artifactual due to motion artifact through this region. No other hemodynamically significant stenosis about either carotid artery system. 3. Possible short-segment moderate stenosis versus artifact involving the pre foraminal left V1 segment. Vertebral arteries otherwise patent within the neck. 4. Possible additional moderate to severe stenosis involving the distal left subclavian artery as above. Electronically Signed   By: Rise Mu M.D.   On: 01/25/2021 22:17   MR Angiogram Neck W or Wo Contrast  Result Date: 01/25/2021 CLINICAL DATA:  Initial evaluation for neuro deficit, stroke, TIA. EXAM: MRI HEAD WITHOUT CONTRAST MRA HEAD WITHOUT CONTRAST MRA NECK WITHOUT AND WITH CONTRAST TECHNIQUE: Multiplanar, multi-echo pulse sequences of the brain and surrounding structures were acquired without intravenous contrast. Angiographic images of the Circle of Willis were acquired using MRA technique without intravenous contrast. Angiographic images of the neck were acquired using MRA technique without and with intravenous contrast. Carotid stenosis measurements (when applicable) are obtained utilizing NASCET criteria, using the distal internal carotid diameter as the denominator. CONTRAST:  9mL GADAVIST GADOBUTROL 1 MMOL/ML IV SOLN COMPARISON:  None available. FINDINGS: MRI HEAD FINDINGS Brain: Cerebral volume within normal limits for age. Scattered patchy T2/FLAIR hyperintensity seen within the periventricular and deep white matter both cerebral hemispheres as well as the pons, nonspecific, but most likely related chronic microvascular ischemic disease, mild in nature. No abnormal foci of restricted diffusion to suggest acute or subacute ischemia. Gray-white matter differentiation maintained. No encephalomalacia to suggest chronic cortical infarction. No evidence for acute or chronic intracranial hemorrhage. No mass lesion, midline shift or mass effect. No hydrocephalus or extra-axial fluid collection.  Pituitary gland suprasellar region within normal limits. Midline structures intact and normal. Vascular: Major intracranial vascular flow voids are maintained. Skull and upper cervical spine: Craniocervical junction within normal limits. Bone marrow signal intensity normal. No scalp soft tissue abnormality. Sinuses/Orbits: Globes and orbital soft tissues within normal limits. Paranasal sinuses are clear. Trace left mastoid effusion noted, of doubtful significance. Inner ear structures grossly normal. Other: None. MRA HEAD FINDINGS Anterior circulation: Visualized distal cervical segments of the internal carotid arteries are patent with antegrade flow. Petrous, cavernous, and supraclinoid segments patent without stenosis. 4 mm outpouching extending laterally from the cavernous segment of the right ICA consistent with an aneurysm (series 4, image 77). A1 segments patent bilaterally. Left A1 partially fenestrated/duplicated. Normal anterior communicating artery complex. Anterior cerebral arteries patent to their distal aspects without stenosis. No M1 stenosis or occlusion. Normal MCA bifurcations. Distal MCA branches well perfused and symmetric. Posterior circulation: Left vertebral artery dominant and widely patent to the vertebrobasilar junction. Right vertebral artery hypoplastic and largely terminates in PICA, although a tiny branch ascending towards the vertebrobasilar junction on time-of-flight sequence. Both PICA origins patent and normal. Basilar patent to its distal aspect without stenosis. Superior  cerebral arteries patent bilaterally. PCA supplied via the basilar as well as robust bilateral posterior communicating arteries. PCAs well perfused to their distal aspects without stenosis. Anatomic variants: Hypoplastic right vertebral artery largely terminates in PICA. MRA NECK FINDINGS Aortic arch: Examination degraded by motion artifact. Visualized aortic arch normal caliber with normal 3 vessel morphology. No  hemodynamically significant stenosis seen about the origin of the great vessels. Right carotid system: Right CCA patent from its origin to the bifurcation without stenosis. No significant atheromatous narrowing about the right carotid bulb. Focal tortuosity with apparent irregularity involving the proximal right ICA noted, favored to be artifactual due to motion artifact through this region (series 1405, image 8). The right ICA appears to be patent without significant stenosis at this level on corresponding time-of-flight sequence. Right ICA patent distally to the skull base without stenosis, evidence for dissection, or occlusion. Left carotid system: Left CCA patent from its origin to the bifurcation without stenosis. Left carotid bifurcation somewhat low lying in the neck. No significant atheromatous narrowing about the left carotid bulb. Left ICA patent distally without stenosis, evidence for dissection or occlusion. Vertebral arteries: Both vertebral arteries arise from the subclavian arteries. No proximal subclavian artery stenosis. Left vertebral artery dominant. Possible short-segment moderate stenosis versus artifact noted involving the pre foraminal left V1 segment (series 1408, image 7). Vertebral arteries otherwise patent within the neck without stenosis, evidence for dissection or occlusion. Other: Note made of a possible additional moderate to severe stenosis involving the distal left subclavian artery (series 1405, image 12). Again, there is some motion artifact through this region, limiting the certainty of this finding. IMPRESSION: MRI HEAD: 1. No acute intracranial infarct or other abnormality. 2. Mild chronic microvascular ischemic disease for age. MRA HEAD: 1. Negative intracranial MRA for large vessel occlusion. No hemodynamically significant or correctable stenosis. 2. 4 mm cavernous right ICA aneurysm. MRA NECK: 1. Motion degraded exam. 2. Focal tortuosity with apparent irregularity involving  the proximal right ICA, favored to be artifactual due to motion artifact through this region. No other hemodynamically significant stenosis about either carotid artery system. 3. Possible short-segment moderate stenosis versus artifact involving the pre foraminal left V1 segment. Vertebral arteries otherwise patent within the neck. 4. Possible additional moderate to severe stenosis involving the distal left subclavian artery as above. Electronically Signed   By: Rise Mu M.D.   On: 01/25/2021 22:17   MR BRAIN WO CONTRAST  Result Date: 01/25/2021 CLINICAL DATA:  Initial evaluation for neuro deficit, stroke, TIA. EXAM: MRI HEAD WITHOUT CONTRAST MRA HEAD WITHOUT CONTRAST MRA NECK WITHOUT AND WITH CONTRAST TECHNIQUE: Multiplanar, multi-echo pulse sequences of the brain and surrounding structures were acquired without intravenous contrast. Angiographic images of the Circle of Willis were acquired using MRA technique without intravenous contrast. Angiographic images of the neck were acquired using MRA technique without and with intravenous contrast. Carotid stenosis measurements (when applicable) are obtained utilizing NASCET criteria, using the distal internal carotid diameter as the denominator. CONTRAST:  9mL GADAVIST GADOBUTROL 1 MMOL/ML IV SOLN COMPARISON:  None available. FINDINGS: MRI HEAD FINDINGS Brain: Cerebral volume within normal limits for age. Scattered patchy T2/FLAIR hyperintensity seen within the periventricular and deep white matter both cerebral hemispheres as well as the pons, nonspecific, but most likely related chronic microvascular ischemic disease, mild in nature. No abnormal foci of restricted diffusion to suggest acute or subacute ischemia. Gray-white matter differentiation maintained. No encephalomalacia to suggest chronic cortical infarction. No evidence for acute or chronic intracranial hemorrhage. No  mass lesion, midline shift or mass effect. No hydrocephalus or extra-axial  fluid collection. Pituitary gland suprasellar region within normal limits. Midline structures intact and normal. Vascular: Major intracranial vascular flow voids are maintained. Skull and upper cervical spine: Craniocervical junction within normal limits. Bone marrow signal intensity normal. No scalp soft tissue abnormality. Sinuses/Orbits: Globes and orbital soft tissues within normal limits. Paranasal sinuses are clear. Trace left mastoid effusion noted, of doubtful significance. Inner ear structures grossly normal. Other: None. MRA HEAD FINDINGS Anterior circulation: Visualized distal cervical segments of the internal carotid arteries are patent with antegrade flow. Petrous, cavernous, and supraclinoid segments patent without stenosis. 4 mm outpouching extending laterally from the cavernous segment of the right ICA consistent with an aneurysm (series 4, image 77). A1 segments patent bilaterally. Left A1 partially fenestrated/duplicated. Normal anterior communicating artery complex. Anterior cerebral arteries patent to their distal aspects without stenosis. No M1 stenosis or occlusion. Normal MCA bifurcations. Distal MCA branches well perfused and symmetric. Posterior circulation: Left vertebral artery dominant and widely patent to the vertebrobasilar junction. Right vertebral artery hypoplastic and largely terminates in PICA, although a tiny branch ascending towards the vertebrobasilar junction on time-of-flight sequence. Both PICA origins patent and normal. Basilar patent to its distal aspect without stenosis. Superior cerebral arteries patent bilaterally. PCA supplied via the basilar as well as robust bilateral posterior communicating arteries. PCAs well perfused to their distal aspects without stenosis. Anatomic variants: Hypoplastic right vertebral artery largely terminates in PICA. MRA NECK FINDINGS Aortic arch: Examination degraded by motion artifact. Visualized aortic arch normal caliber with normal 3  vessel morphology. No hemodynamically significant stenosis seen about the origin of the great vessels. Right carotid system: Right CCA patent from its origin to the bifurcation without stenosis. No significant atheromatous narrowing about the right carotid bulb. Focal tortuosity with apparent irregularity involving the proximal right ICA noted, favored to be artifactual due to motion artifact through this region (series 1405, image 8). The right ICA appears to be patent without significant stenosis at this level on corresponding time-of-flight sequence. Right ICA patent distally to the skull base without stenosis, evidence for dissection, or occlusion. Left carotid system: Left CCA patent from its origin to the bifurcation without stenosis. Left carotid bifurcation somewhat low lying in the neck. No significant atheromatous narrowing about the left carotid bulb. Left ICA patent distally without stenosis, evidence for dissection or occlusion. Vertebral arteries: Both vertebral arteries arise from the subclavian arteries. No proximal subclavian artery stenosis. Left vertebral artery dominant. Possible short-segment moderate stenosis versus artifact noted involving the pre foraminal left V1 segment (series 1408, image 7). Vertebral arteries otherwise patent within the neck without stenosis, evidence for dissection or occlusion. Other: Note made of a possible additional moderate to severe stenosis involving the distal left subclavian artery (series 1405, image 12). Again, there is some motion artifact through this region, limiting the certainty of this finding. IMPRESSION: MRI HEAD: 1. No acute intracranial infarct or other abnormality. 2. Mild chronic microvascular ischemic disease for age. MRA HEAD: 1. Negative intracranial MRA for large vessel occlusion. No hemodynamically significant or correctable stenosis. 2. 4 mm cavernous right ICA aneurysm. MRA NECK: 1. Motion degraded exam. 2. Focal tortuosity with apparent  irregularity involving the proximal right ICA, favored to be artifactual due to motion artifact through this region. No other hemodynamically significant stenosis about either carotid artery system. 3. Possible short-segment moderate stenosis versus artifact involving the pre foraminal left V1 segment. Vertebral arteries otherwise patent within the neck. 4.  Possible additional moderate to severe stenosis involving the distal left subclavian artery as above. Electronically Signed   By: Rise Mu M.D.   On: 01/25/2021 22:17   ECHOCARDIOGRAM COMPLETE BUBBLE STUDY  Result Date: 01/26/2021    ECHOCARDIOGRAM REPORT   Patient Name:   Sandy Ward Date of Exam: 01/26/2021 Medical Rec #:  [id number]    Height:       65.0 in Accession #:    [id number]   Weight:       214.7 lb Date of Birth:  10-Apr-1956   BSA:          2.039 m Patient Age:    64 years     BP:           155/75 mmHg Patient Gender: F            HR:           62 bpm. Exam Location:  Inpatient Procedure: 2D Echo, Cardiac Doppler, Color Doppler and Saline Contrast Bubble            Study Indications:    Stroke  History:        Patient has no prior history of Echocardiogram examinations.                 Risk Factors:Diabetes, Hypertension and Current Smoker.  Sonographer:    Ross Ludwig RDCS (AE) Referring Phys: [id number] CAROLE N HALL IMPRESSIONS  1. AoV not well visualized. Appears calcified with indeterminant number of cusps. Vmax 3.2 m/s, MG 25 mmHG, AVA 1.0 cm2, DI 0.35. All consistent with moderate aortic stenosis. No regurgitation. The aortic valve is calcified. Aortic valve regurgitation is not visualized. Moderate aortic valve stenosis.  2. Agitated saline contrast bubble study was negative, with no evidence of any interatrial shunt.  3. Left ventricular ejection fraction, by estimation, is 65 to 70%. The left ventricle has normal function. The left ventricle has no regional wall motion abnormalities. There is mild concentric left ventricular  hypertrophy. Left ventricular diastolic parameters were normal.  4. Right ventricular systolic function is normal. The right ventricular size is normal. There is normal pulmonary artery systolic pressure. The estimated right ventricular systolic pressure is 32.2 mmHg.  5. The mitral valve is grossly normal. Trivial mitral valve regurgitation. No evidence of mitral stenosis.  6. The inferior vena cava is normal in size with greater than 50% respiratory variability, suggesting right atrial pressure of 3 mmHg. Conclusion(s)/Recommendation(s): No intracardiac source of embolism detected on this transthoracic study. A transesophageal echocardiogram is recommended to exclude cardiac source of embolism if clinically indicated. FINDINGS  Left Ventricle: Left ventricular ejection fraction, by estimation, is 65 to 70%. The left ventricle has normal function. The left ventricle has no regional wall motion abnormalities. The left ventricular internal cavity size was normal in size. There is  mild concentric left ventricular hypertrophy. Left ventricular diastolic parameters were normal. Right Ventricle: The right ventricular size is normal. No increase in right ventricular wall thickness. Right ventricular systolic function is normal. There is normal pulmonary artery systolic pressure. The tricuspid regurgitant velocity is 2.70 m/s, and  with an assumed right atrial pressure of 3 mmHg, the estimated right ventricular systolic pressure is 32.2 mmHg. Left Atrium: Left atrial size was normal in size. Right Atrium: Right atrial size was normal in size. Pericardium: There is no evidence of pericardial effusion. Mitral Valve: The mitral valve is grossly normal. Mild mitral annular calcification. Trivial mitral valve regurgitation. No evidence of mitral valve  stenosis. MV peak gradient, 4.3 mmHg. The mean mitral valve gradient is 2.0 mmHg. Tricuspid Valve: The tricuspid valve is grossly normal. Tricuspid valve regurgitation is mild .  No evidence of tricuspid stenosis. Aortic Valve: AoV not well visualized. Appears calcified with indeterminant number of cusps. Vmax 3.2 m/s, MG 25 mmHG, AVA 1.0 cm2, DI 0.35. All consistent with moderate aortic stenosis. No regurgitation. The aortic valve is calcified. Aortic valve regurgitation is not visualized. Moderate aortic stenosis is present. Aortic valve mean gradient measures 25.0 mmHg. Aortic valve peak gradient measures 42.2 mmHg. Aortic valve area, by VTI measures 1.00 cm. Pulmonic Valve: The pulmonic valve was grossly normal. Pulmonic valve regurgitation is not visualized. No evidence of pulmonic stenosis. Aorta: The aortic root is normal in size and structure. Venous: The inferior vena cava is normal in size with greater than 50% respiratory variability, suggesting right atrial pressure of 3 mmHg. IAS/Shunts: The atrial septum is grossly normal. Agitated saline contrast was given intravenously to evaluate for intracardiac shunting. Agitated saline contrast bubble study was negative, with no evidence of any interatrial shunt.  LEFT VENTRICLE PLAX 2D LVIDd:         4.70 cm  Diastology LVIDs:         3.40 cm  LV e' medial:    10.80 cm/s LV PW:         1.20 cm  LV E/e' medial:  7.6 LV IVS:        1.20 cm  LV e' lateral:   11.70 cm/s LVOT diam:     1.90 cm  LV E/e' lateral: 7.0 LV SV:         78 LV SV Index:   38 LVOT Area:     2.84 cm  RIGHT VENTRICLE             IVC RV Basal diam:  2.90 cm     IVC diam: 2.00 cm RV S prime:     11.20 cm/s TAPSE (M-mode): 3.1 cm LEFT ATRIUM             Index       RIGHT ATRIUM           Index LA diam:        4.00 cm 1.96 cm/m  RA Area:     21.60 cm LA Vol (A2C):   68.1 ml 33.40 ml/m RA Volume:   65.70 ml  32.23 ml/m LA Vol (A4C):   55.8 ml 27.37 ml/m LA Biplane Vol: 62.6 ml 30.71 ml/m  AORTIC VALVE AV Area (Vmax):    0.99 cm AV Area (Vmean):   0.97 cm AV Area (VTI):     1.00 cm AV Vmax:           325.00 cm/s AV Vmean:          230.500 cm/s AV VTI:             0.779 m AV Peak Grad:      42.2 mmHg AV Mean Grad:      25.0 mmHg LVOT Vmax:         114.00 cm/s LVOT Vmean:        79.100 cm/s LVOT VTI:          0.275 m LVOT/AV VTI ratio: 0.35  AORTA Ao Root diam: 3.30 cm MITRAL VALVE               TRICUSPID VALVE MV Area (PHT): 4.39 cm    TR Peak grad:  29.2 mmHg MV Peak grad:  4.3 mmHg    TR Vmax:        270.00 cm/s MV Mean grad:  2.0 mmHg MV Vmax:       1.04 m/s    SHUNTS MV Vmean:      61.7 cm/s   Systemic VTI:  0.28 m MV Decel Time: 173 msec    Systemic Diam: 1.90 cm MV E velocity: 82.30 cm/s MV A velocity: 71.80 cm/s MV E/A ratio:  1.15 Lennie Odor MD Electronically signed by Lennie Odor MD Signature Date/Time: 01/26/2021/11:58:10 AM    Final    CT ANGIO HEAD NECK W WO CM (CODE STROKE)  Result Date: 01/26/2021 CLINICAL DATA:  Stroke follow-up. EXAM: CT ANGIOGRAPHY HEAD AND NECK TECHNIQUE: Multidetector CT imaging of the head and neck was performed using the standard protocol during bolus administration of intravenous contrast. Multiplanar CT image reconstructions and MIPs were obtained to evaluate the vascular anatomy. Carotid stenosis measurements (when applicable) are obtained utilizing NASCET criteria, using the distal internal carotid diameter as the denominator. CONTRAST:  OMNIPAQUE IOHEXOL 350 MG/ML SOLN COMPARISON:  MRI from yesterday. FINDINGS: CT HEAD FINDINGS Brain: No evidence of acute large vascular territory infarction, hemorrhage, hydrocephalus, extra-axial collection or mass lesion/mass effect. Vascular: See below Skull: No acute fracture. Sinuses/Orbits: Clear sinuses.  Unremarkable orbits. Other: No mastoid effusions. CTA NECK FINDINGS Aortic arch: Patent great vessel origins.  Atherosclerosis. Right carotid system: Moderate predominately calcific atherosclerosis at the carotid bifurcation without greater than 50% narrowing. Left carotid system: Mixed calcific and noncalcific atherosclerosis at the carotid bifurcation without greater than 50%  stenosis. Vertebral arteries: Left dominant no significant (greater than 50%) stenosis. The non dominant right vertebral artery is small throughout its course. Skeleton: Moderate multilevel degenerative disc disease and facet arthropathy. Other neck: Prominent tonsillar tissue bilaterally without evidence of acute abnormality. Upper chest: Visualized lung apices are clear. 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 Bilateral cavernous and paraclinoid calcific atherosclerosis without greater than 50% narrowing. Bilateral MCAs and ACAs are patent without proximal hemodynamically significant stenosis. Posterior circulation: Small vertebrobasilar system with prominent bilateral posterior communicating arteries, anatomic variant. Bilateral intradural vertebral arteries, basilar artery and PCAs are patent without proximal hemodynamically significant stenosis. No aneurysm identified. Venous sinuses: As permitted by contrast timing, patent. Review of the MIP images confirms the above findings IMPRESSION: CT head: No evidence of acute intracranial abnormality. CTA head: 1. No large vessel occlusion or hemodynamically significant proximal stenosis. 2. 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 CTA neck: Moderate bilateral carotid bifurcation atherosclerosis without greater than 50% stenosis. Electronically Signed   By: Feliberto Harts MD   On: 01/26/2021 11:20    Procedures .Critical Care  Date/Time: 01/26/2021 4:06 PM Performed by: Milagros Loll, MD Authorized by: Milagros Loll, MD   Critical care provider statement:    Critical care time (minutes):  43   Critical care was time spent personally by me on the following activities:  Discussions with consultants, evaluation of patient's response to treatment, examination of patient, ordering and performing treatments and interventions,  ordering and review of laboratory studies, ordering and review of radiographic studies, pulse oximetry, re-evaluation of patient's condition, obtaining history from patient or surrogate and review of old charts   Medications Ordered in ED Medications  aspirin chewable tablet 81 mg (81 mg Oral Given 01/26/21 1145)  rosuvastatin (CRESTOR) tablet 40  mg (40 mg Oral Given 01/26/21 1145)  valACYclovir (VALTREX) tablet 1,000 mg (1,000 mg Oral Not Given 01/26/21 1145)  fluticasone (FLONASE) 50 MCG/ACT nasal spray 1 spray (1 spray Each Nare Given 01/25/21 2312)  insulin aspart (novoLOG) injection 0-9 Units (0 Units Subcutaneous Not Given 01/26/21 1148)  insulin aspart (novoLOG) injection 0-5 Units (0 Units Subcutaneous Not Given 01/25/21 2151)  enoxaparin (LOVENOX) injection 40 mg (40 mg Subcutaneous Given 01/25/21 2304)  acetaminophen (TYLENOL) tablet 650 mg (has no administration in time range)  ondansetron (ZOFRAN) injection 4 mg (has no administration in time range)  polyethylene glycol (MIRALAX / GLYCOLAX) packet 17 g (has no administration in time range)  melatonin tablet 3 mg (has no administration in time range)  pantoprazole (PROTONIX) EC tablet 40 mg (has no administration in time range)  lactated ringers infusion ( Intravenous New Bag/Given 01/25/21 2303)  ipratropium-albuterol (DUONEB) 0.5-2.5 (3) MG/3ML nebulizer solution 3 mL (has no administration in time range)  clopidogrel (PLAVIX) tablet 75 mg (75 mg Oral Given 01/26/21 1144)  gadobutrol (GADAVIST) 1 MMOL/ML injection 9 mL (9 mLs Intravenous Contrast Given 01/25/21 2046)  potassium chloride SA (KLOR-CON) CR tablet 40 mEq (40 mEq Oral Given 01/26/21 1144)  iohexol (OMNIPAQUE) 350 MG/ML injection 100 mL (100 mLs Intravenous Contrast Given 01/26/21 1055)    ED Course  I have reviewed the triage vital signs and the nursing notes.  Pertinent labs & imaging results that were available during my care of the patient were reviewed by me and considered  in my medical decision making (see chart for details).  Clinical Course as of 01/26/21 1606  Fri Jan 25, 2021  1749 [place] plaque superior arcade, 2 small emboli just down stream - [facility] [phone] [RD]    Clinical Course User Index [RD] Milagros Loll, MD   MDM Rules/Calculators/A&P                          65 year old lady presents to ER from ophthalmology office.  Concern for branch retinal artery occlusion.  No other neurologic complaint.  Discussed with her ophthalmologist, recommends neuro consult, admission.  Discussed with Dr. Amada Jupiter.  He will see as consult, recommends admission, MRI and MRA head/neck.  Consult the hospitalist for admit.   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  Change in vision    Rx / DC Orders ED Discharge Orders          Ordered    rosuvastatin (CRESTOR) 40 MG tablet  Daily        01/26/21 1214    clopidogrel (PLAVIX) 75 MG tablet  Daily,   Status:  Discontinued        01/26/21 1214    clopidogrel (PLAVIX) 75 MG tablet  Daily        01/26/21 1305    Increase activity slowly        01/26/21 1220    Diet - low sodium heart healthy        01/26/21 1220    Diet Carb Modified        01/26/21 1220    aspirin 81 MG chewable tablet  Daily        01/26/21 1305    Ambulatory referral to Neurology       Comments: Follow up with stroke clinic NP (Jessica [place] or Darrol Angel, if both not available, consider Manson Allan, or Lucia Gaskins) at [facility]  in about 4 weeks. Thanks.   01/26/21 1540             Milagros Loll, MD 01/26/21 1606    Milagros Loll, MD 01/26/21 [phone]

## 2021-01-26 NOTE — Progress Notes (Signed)
SLP Cancellation Note  Patient Details Name: Sandy Ward MRN: [id number] DOB: [birth year]   Cancelled treatment:       Reason Eval/Treat Not Completed: SLP screened - note that pt is currently on a diet. RN reports that pt passed nursing screen. Education provided on the need for documentation of [facility] screen in chart. RN reports that she will provide documentation. Will therefore hold swallowing evaluation for now.     Mahala Menghini., M.A. CCC-SLP Acute Rehabilitation Services Pager [phone] Office [phone]  01/26/2021, 10:32 AM

## 2021-01-26 NOTE — Consult Note (Signed)
Neurology Consultation Reason for Consult: Branch artery occlusion Referring Physician: [place] Crane, C  CC: Visual change  History is obtained from: Patient  HPI: Sandy Ward is a 65 y.o. female who noticed a blurry spot in the left eye in the inferior aspect of her vision when she awoke this morning.  She denies any other symptoms such as numbness, weakness, double vision, nausea, vomiting, eye pain, or other symptoms.  Due to the symptoms, she sought care at her ophthalmologist who on funduscopic exam visualized emboli.   She was referred to the hospital for stroke work-up.   LKW: 6/23 prior to bed tpa given?: no, outside of window   ROS: A 14 point ROS was performed and is negative except as noted in the HPI.  Past Medical History:  Diagnosis Date   Diabetes mellitus without complication ([place])    Hypertension      History reviewed. No pertinent family history.   Social History:  reports that she has never smoked. She has never used smokeless tobacco. No history on file for alcohol use and drug use.   Exam: Current vital signs: BP (!) 155/75 (BP Location: Right Arm)   Pulse 62   Temp 98.3 F (36.8 C) (Oral)   Resp 13   Ht 5' 5"  (1.651 m)   Wt 97.4 kg   SpO2 97%   BMI 35.73 kg/m  Vital signs in last 24 hours: Temp:  [98.2 F (36.8 C)-98.3 F (36.8 C)] 98.3 F (36.8 C) (06/25 0357) Pulse Rate:  [62-73] 62 (06/25 0357) Resp:  [12-17] 13 (06/25 0357) BP: (124-155)/(67-75) 155/75 (06/25 0357) SpO2:  [96 %-100 %] 97 % (06/25 0357) Weight:  [97.4 kg] 97.4 kg (06/24 2100)   Physical Exam  Constitutional: Appears well-developed and well-nourished.  Psych: Affect appropriate to situation Eyes: No scleral injection HENT: No OP obstruction MSK: no joint deformities.  Cardiovascular: Normal rate and regular rhythm.  Respiratory: Effort normal, non-labored breathing GI: Soft.  No distension. There is no tenderness.  Skin: WDI  Neuro: Mental Status: Patient is awake,  alert, oriented to person, place, month, year, and situation. Patient is able to give a clear and coherent history. No signs of aphasia or neglect Cranial Nerves: II: I am unable to tease out any visual field deficit on my examination at the bedside. Pupils are equal, round, and reactive to light.   III,IV, VI: EOMI without ptosis or diploplia.  V: Facial sensation is symmetric to temperature VII: Facial movement is symmetric.  VIII: hearing is intact to voice X: Uvula elevates symmetrically XI: Shoulder shrug is symmetric. XII: tongue is midline without atrophy or fasciculations.  Motor: Tone is normal. Bulk is normal. 5/5 strength was present in all four extremities.  Sensory: Sensation is symmetric to light touch and temperature in the arms and legs. 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  She has been admitted for embolic work-up.  Recommendations: - HgbA1c, fasting lipid panel - MRI, MRA  of the head and neck.  - Frequent neuro checks - Echocardiogram - Prophylactic therapy-Antiplatelet med: Aspirin - dose 368m PO or 3035mPR - Risk factor modification - Telemetry monitoring - PT consult, OT consult, Speech consult - ESR, CRP - Stroke team to follow  Roland Rack, MD [facility] [phone]  If 7pm- 7am, please page neurology on call as listed in [place].

## 2021-01-26 NOTE — Discharge Summary (Signed)
Physician Discharge Summary  Sandy Ward BCW:[id number] DOB: 03/08/1956 DOA: 01/25/2021  PCP: Medicine, [facility]  Admit date: 01/25/2021 Discharge date: 01/26/2021  Admitted From: Home Disposition: Home   Home Health: None Equipment/Devices: None Discharge Condition: Stable CODE STATUS: Full Diet recommendation: Heart healthy and carb controlled  Hospital course: 65 year old female with a past medical history of hypertension, diabetes who was admitted for blurry spot in the left eye in the inferior aspect of her vision when she woke up this morning.  She was being evaluated by her ophthalmologist who noted to have emboli on funduscopic exam.  Sent to the emergency department and stroke alert was called.  tPA not given.  MRI showed no acute abnormality.  MRI of the head and neck and CT head and neck unremarkable except for a 4 mm right cavernous ICA aneurysm.  She will be referred to interventional radiology upon discharge.  Echo showed a EF of 65 to 70%.  Moderate aortic stenosis was noted.  She will need to follow-up with her primary care physician and have referral to local cardiology for periodic echocardiogram monitoring of her aortic stenosis.  The etiology of her symptoms still concerning for embolic phenomena given funduscopic exam visualized emboli.  Currently no vascular findings were noted.  A. fib needs to be ruled out.  Neurology recommended 30-day cardiac event monitoring as outpatient first if negative consider loop recorder.  Started on dual antiplatelet therapy with aspirin and Plavix and stop the aspirin after 3 weeks as she was already on this prior to presentation.  At that point continue with Plavix monotherapy her Crestor was increased from 20-40 due to LDL not being at goal.  Cleared from a neurology perspective for discharge.  She will follow-up at local stroke clinic in about 4 weeks.  Discharge Diagnoses:  Active Problems:   Visual  loss    Discharge Instructions  Discharge Instructions     Ambulatory referral to Neurology   Complete by: As directed    Follow up with stroke clinic NP (Jessica Vanschaick or Darrol Angel, if both not available, consider Manson Allan, or Ahern) at [facility] in about 4 weeks. Thanks.   Diet - low sodium heart healthy   Complete by: As directed    Diet Carb Modified   Complete by: As directed    Increase activity slowly   Complete by: As directed       Allergies as of 01/26/2021   No Known Allergies      Medication List     TAKE these medications    aspirin 81 MG chewable tablet Chew 1 tablet (81 mg total) by mouth daily for 21 days.   clopidogrel 75 MG tablet Commonly known as: PLAVIX Take 1 tablet (75 mg total) by mouth daily. Start taking on: January 27, 2021   fluticasone 50 MCG/ACT nasal spray Commonly known as: FLONASE Place 1 spray into both nostrils daily.   hydrochlorothiazide 25 MG tablet Commonly known as: HYDRODIURIL Take 25 mg by mouth daily.   losartan 50 MG tablet Commonly known as: COZAAR Take 50 mg by mouth daily.   metFORMIN 500 MG tablet Commonly known as: GLUCOPHAGE Take 500 mg by mouth daily.   rosuvastatin 40 MG tablet Commonly known as: CRESTOR Take 1 tablet (40 mg total) by mouth daily. Start taking on: January 27, 2021 What changed:  medication strength how much to take when to take this   triamcinolone cream 0.1 % Commonly known as: KENALOG Apply 1  application topically 2 (two) times daily as needed for skin breakdown.   valACYclovir 1000 MG tablet Commonly known as: VALTREX Take 1,000 mg by mouth 2 (two) times daily.        Follow-up Information     Medicine, [facility] Follow up in 1 week(s).   Specialty: Family Medicine        Micki Riley, MD Follow up.   Specialties: Neurology, Radiology Contact information: [address] [place] [address] [phone]          Julieanne Cotton, MD. Schedule an appointment as soon as possible for a visit in 1 month(s).   Specialties: Interventional Radiology, Radiology Contact information: [address] [place] [address] [phone]                No Known Allergies  Consultations: Neurology   Procedures/Studies: MR ANGIO HEAD WO CONTRAST  Result Date: 01/25/2021 CLINICAL DATA:  Initial evaluation for neuro deficit, stroke, TIA. EXAM: MRI HEAD WITHOUT CONTRAST MRA HEAD WITHOUT CONTRAST MRA NECK WITHOUT AND WITH CONTRAST TECHNIQUE: Multiplanar, multi-echo pulse sequences of the brain and surrounding structures were acquired without intravenous contrast. Angiographic images of the Circle of Willis were acquired using MRA technique without intravenous contrast. Angiographic images of the neck were acquired using MRA technique without and with intravenous contrast. Carotid stenosis measurements (when applicable) are obtained utilizing NASCET criteria, using the distal internal carotid diameter as the denominator. CONTRAST:  9mL GADAVIST GADOBUTROL 1 MMOL/ML IV SOLN COMPARISON:  None available. FINDINGS: MRI HEAD FINDINGS Brain: Cerebral volume within normal limits for age. Scattered patchy T2/FLAIR hyperintensity seen within the periventricular and deep white matter both cerebral hemispheres as well as the pons, nonspecific, but most likely related chronic microvascular ischemic disease, mild in nature. No abnormal foci of restricted diffusion to suggest acute or subacute ischemia. Gray-white matter differentiation maintained. No encephalomalacia to suggest chronic cortical infarction. No evidence for acute or chronic intracranial hemorrhage. No mass lesion, midline shift or mass effect. No hydrocephalus or extra-axial fluid collection. Pituitary gland suprasellar region within normal limits. Midline structures intact and normal. Vascular: Major intracranial vascular flow voids are maintained. Skull and upper  cervical spine: Craniocervical junction within normal limits. Bone marrow signal intensity normal. No scalp soft tissue abnormality. Sinuses/Orbits: Globes and orbital soft tissues within normal limits. Paranasal sinuses are clear. Trace left mastoid effusion noted, of doubtful significance. Inner ear structures grossly normal. Other: None. MRA HEAD FINDINGS Anterior circulation: Visualized distal cervical segments of the internal carotid arteries are patent with antegrade flow. Petrous, cavernous, and supraclinoid segments patent without stenosis. 4 mm outpouching extending laterally from the cavernous segment of the right ICA consistent with an aneurysm (series 4, image 77). A1 segments patent bilaterally. Left A1 partially fenestrated/duplicated. Normal anterior communicating artery complex. Anterior cerebral arteries patent to their distal aspects without stenosis. No M1 stenosis or occlusion. Normal MCA bifurcations. Distal MCA branches well perfused and symmetric. Posterior circulation: Left vertebral artery dominant and widely patent to the vertebrobasilar junction. Right vertebral artery hypoplastic and largely terminates in PICA, although a tiny branch ascending towards the vertebrobasilar junction on time-of-flight sequence. Both PICA origins patent and normal. Basilar patent to its distal aspect without stenosis. Superior cerebral arteries patent bilaterally. PCA supplied via the basilar as well as robust bilateral posterior communicating arteries. PCAs well perfused to their distal aspects without stenosis. Anatomic variants: Hypoplastic right vertebral artery largely terminates in PICA. MRA NECK FINDINGS Aortic arch: Examination  degraded by motion artifact. Visualized aortic arch normal caliber with normal 3 vessel morphology. No hemodynamically significant stenosis seen about the origin of the great vessels. Right carotid system: Right CCA patent from its origin to the bifurcation without stenosis. No  significant atheromatous narrowing about the right carotid bulb. Focal tortuosity with apparent irregularity involving the proximal right ICA noted, favored to be artifactual due to motion artifact through this region (series 1405, image 8). The right ICA appears to be patent without significant stenosis at this level on corresponding time-of-flight sequence. Right ICA patent distally to the skull base without stenosis, evidence for dissection, or occlusion. Left carotid system: Left CCA patent from its origin to the bifurcation without stenosis. Left carotid bifurcation somewhat low lying in the neck. No significant atheromatous narrowing about the left carotid bulb. Left ICA patent distally without stenosis, evidence for dissection or occlusion. Vertebral arteries: Both vertebral arteries arise from the subclavian arteries. No proximal subclavian artery stenosis. Left vertebral artery dominant. Possible short-segment moderate stenosis versus artifact noted involving the pre foraminal left V1 segment (series 1408, image 7). Vertebral arteries otherwise patent within the neck without stenosis, evidence for dissection or occlusion. Other: Note made of a possible additional moderate to severe stenosis involving the distal left subclavian artery (series 1405, image 12). Again, there is some motion artifact through this region, limiting the certainty of this finding. IMPRESSION: MRI HEAD: 1. No acute intracranial infarct or other abnormality. 2. Mild chronic microvascular ischemic disease for age. MRA HEAD: 1. Negative intracranial MRA for large vessel occlusion. No hemodynamically significant or correctable stenosis. 2. 4 mm cavernous right ICA aneurysm. MRA NECK: 1. Motion degraded exam. 2. Focal tortuosity with apparent irregularity involving the proximal right ICA, favored to be artifactual due to motion artifact through this region. No other hemodynamically significant stenosis about either carotid artery system.  3. Possible short-segment moderate stenosis versus artifact involving the pre foraminal left V1 segment. Vertebral arteries otherwise patent within the neck. 4. Possible additional moderate to severe stenosis involving the distal left subclavian artery as above. Electronically Signed   By: Rise Mu M.D.   On: 01/25/2021 22:17   MR Angiogram Neck W or Wo Contrast  Result Date: 01/25/2021 CLINICAL DATA:  Initial evaluation for neuro deficit, stroke, TIA. EXAM: MRI HEAD WITHOUT CONTRAST MRA HEAD WITHOUT CONTRAST MRA NECK WITHOUT AND WITH CONTRAST TECHNIQUE: Multiplanar, multi-echo pulse sequences of the brain and surrounding structures were acquired without intravenous contrast. Angiographic images of the Circle of Willis were acquired using MRA technique without intravenous contrast. Angiographic images of the neck were acquired using MRA technique without and with intravenous contrast. Carotid stenosis measurements (when applicable) are obtained utilizing NASCET criteria, using the distal internal carotid diameter as the denominator. CONTRAST:  41mL GADAVIST GADOBUTROL 1 MMOL/ML IV SOLN COMPARISON:  None available. FINDINGS: MRI HEAD FINDINGS Brain: Cerebral volume within normal limits for age. Scattered patchy T2/FLAIR hyperintensity seen within the periventricular and deep white matter both cerebral hemispheres as well as the pons, nonspecific, but most likely related chronic microvascular ischemic disease, mild in nature. No abnormal foci of restricted diffusion to suggest acute or subacute ischemia. Gray-white matter differentiation maintained. No encephalomalacia to suggest chronic cortical infarction. No evidence for acute or chronic intracranial hemorrhage. No mass lesion, midline shift or mass effect. No hydrocephalus or extra-axial fluid collection. Pituitary gland suprasellar region within normal limits. Midline structures intact and normal. Vascular: Major intracranial vascular flow voids  are maintained. Skull and upper cervical spine:  Craniocervical junction within normal limits. Bone marrow signal intensity normal. No scalp soft tissue abnormality. Sinuses/Orbits: Globes and orbital soft tissues within normal limits. Paranasal sinuses are clear. Trace left mastoid effusion noted, of doubtful significance. Inner ear structures grossly normal. Other: None. MRA HEAD FINDINGS Anterior circulation: Visualized distal cervical segments of the internal carotid arteries are patent with antegrade flow. Petrous, cavernous, and supraclinoid segments patent without stenosis. 4 mm outpouching extending laterally from the cavernous segment of the right ICA consistent with an aneurysm (series 4, image 77). A1 segments patent bilaterally. Left A1 partially fenestrated/duplicated. Normal anterior communicating artery complex. Anterior cerebral arteries patent to their distal aspects without stenosis. No M1 stenosis or occlusion. Normal MCA bifurcations. Distal MCA branches well perfused and symmetric. Posterior circulation: Left vertebral artery dominant and widely patent to the vertebrobasilar junction. Right vertebral artery hypoplastic and largely terminates in PICA, although a tiny branch ascending towards the vertebrobasilar junction on time-of-flight sequence. Both PICA origins patent and normal. Basilar patent to its distal aspect without stenosis. Superior cerebral arteries patent bilaterally. PCA supplied via the basilar as well as robust bilateral posterior communicating arteries. PCAs well perfused to their distal aspects without stenosis. Anatomic variants: Hypoplastic right vertebral artery largely terminates in PICA. MRA NECK FINDINGS Aortic arch: Examination degraded by motion artifact. Visualized aortic arch normal caliber with normal 3 vessel morphology. No hemodynamically significant stenosis seen about the origin of the great vessels. Right carotid system: Right CCA patent from its origin to the  bifurcation without stenosis. No significant atheromatous narrowing about the right carotid bulb. Focal tortuosity with apparent irregularity involving the proximal right ICA noted, favored to be artifactual due to motion artifact through this region (series 1405, image 8). The right ICA appears to be patent without significant stenosis at this level on corresponding time-of-flight sequence. Right ICA patent distally to the skull base without stenosis, evidence for dissection, or occlusion. Left carotid system: Left CCA patent from its origin to the bifurcation without stenosis. Left carotid bifurcation somewhat low lying in the neck. No significant atheromatous narrowing about the left carotid bulb. Left ICA patent distally without stenosis, evidence for dissection or occlusion. Vertebral arteries: Both vertebral arteries arise from the subclavian arteries. No proximal subclavian artery stenosis. Left vertebral artery dominant. Possible short-segment moderate stenosis versus artifact noted involving the pre foraminal left V1 segment (series 1408, image 7). Vertebral arteries otherwise patent within the neck without stenosis, evidence for dissection or occlusion. Other: Note made of a possible additional moderate to severe stenosis involving the distal left subclavian artery (series 1405, image 12). Again, there is some motion artifact through this region, limiting the certainty of this finding. IMPRESSION: MRI HEAD: 1. No acute intracranial infarct or other abnormality. 2. Mild chronic microvascular ischemic disease for age. MRA HEAD: 1. Negative intracranial MRA for large vessel occlusion. No hemodynamically significant or correctable stenosis. 2. 4 mm cavernous right ICA aneurysm. MRA NECK: 1. Motion degraded exam. 2. Focal tortuosity with apparent irregularity involving the proximal right ICA, favored to be artifactual due to motion artifact through this region. No other hemodynamically significant stenosis  about either carotid artery system. 3. Possible short-segment moderate stenosis versus artifact involving the pre foraminal left V1 segment. Vertebral arteries otherwise patent within the neck. 4. Possible additional moderate to severe stenosis involving the distal left subclavian artery as above. Electronically Signed   By: Rise Mu M.D.   On: 01/25/2021 22:17   MR BRAIN WO CONTRAST  Result Date: 01/25/2021 CLINICAL  DATA:  Initial evaluation for neuro deficit, stroke, TIA. EXAM: MRI HEAD WITHOUT CONTRAST MRA HEAD WITHOUT CONTRAST MRA NECK WITHOUT AND WITH CONTRAST TECHNIQUE: Multiplanar, multi-echo pulse sequences of the brain and surrounding structures were acquired without intravenous contrast. Angiographic images of the Circle of Willis were acquired using MRA technique without intravenous contrast. Angiographic images of the neck were acquired using MRA technique without and with intravenous contrast. Carotid stenosis measurements (when applicable) are obtained utilizing NASCET criteria, using the distal internal carotid diameter as the denominator. CONTRAST:  9mL GADAVIST GADOBUTROL 1 MMOL/ML IV SOLN COMPARISON:  None available. FINDINGS: MRI HEAD FINDINGS Brain: Cerebral volume within normal limits for age. Scattered patchy T2/FLAIR hyperintensity seen within the periventricular and deep white matter both cerebral hemispheres as well as the pons, nonspecific, but most likely related chronic microvascular ischemic disease, mild in nature. No abnormal foci of restricted diffusion to suggest acute or subacute ischemia. Gray-white matter differentiation maintained. No encephalomalacia to suggest chronic cortical infarction. No evidence for acute or chronic intracranial hemorrhage. No mass lesion, midline shift or mass effect. No hydrocephalus or extra-axial fluid collection. Pituitary gland suprasellar region within normal limits. Midline structures intact and normal. Vascular: Major intracranial  vascular flow voids are maintained. Skull and upper cervical spine: Craniocervical junction within normal limits. Bone marrow signal intensity normal. No scalp soft tissue abnormality. Sinuses/Orbits: Globes and orbital soft tissues within normal limits. Paranasal sinuses are clear. Trace left mastoid effusion noted, of doubtful significance. Inner ear structures grossly normal. Other: None. MRA HEAD FINDINGS Anterior circulation: Visualized distal cervical segments of the internal carotid arteries are patent with antegrade flow. Petrous, cavernous, and supraclinoid segments patent without stenosis. 4 mm outpouching extending laterally from the cavernous segment of the right ICA consistent with an aneurysm (series 4, image 77). A1 segments patent bilaterally. Left A1 partially fenestrated/duplicated. Normal anterior communicating artery complex. Anterior cerebral arteries patent to their distal aspects without stenosis. No M1 stenosis or occlusion. Normal MCA bifurcations. Distal MCA branches well perfused and symmetric. Posterior circulation: Left vertebral artery dominant and widely patent to the vertebrobasilar junction. Right vertebral artery hypoplastic and largely terminates in PICA, although a tiny branch ascending towards the vertebrobasilar junction on time-of-flight sequence. Both PICA origins patent and normal. Basilar patent to its distal aspect without stenosis. Superior cerebral arteries patent bilaterally. PCA supplied via the basilar as well as robust bilateral posterior communicating arteries. PCAs well perfused to their distal aspects without stenosis. Anatomic variants: Hypoplastic right vertebral artery largely terminates in PICA. MRA NECK FINDINGS Aortic arch: Examination degraded by motion artifact. Visualized aortic arch normal caliber with normal 3 vessel morphology. No hemodynamically significant stenosis seen about the origin of the great vessels. Right carotid system: Right CCA patent from  its origin to the bifurcation without stenosis. No significant atheromatous narrowing about the right carotid bulb. Focal tortuosity with apparent irregularity involving the proximal right ICA noted, favored to be artifactual due to motion artifact through this region (series 1405, image 8). The right ICA appears to be patent without significant stenosis at this level on corresponding time-of-flight sequence. Right ICA patent distally to the skull base without stenosis, evidence for dissection, or occlusion. Left carotid system: Left CCA patent from its origin to the bifurcation without stenosis. Left carotid bifurcation somewhat low lying in the neck. No significant atheromatous narrowing about the left carotid bulb. Left ICA patent distally without stenosis, evidence for dissection or occlusion. Vertebral arteries: Both vertebral arteries arise from the subclavian arteries. No proximal  subclavian artery stenosis. Left vertebral artery dominant. Possible short-segment moderate stenosis versus artifact noted involving the pre foraminal left V1 segment (series 1408, image 7). Vertebral arteries otherwise patent within the neck without stenosis, evidence for dissection or occlusion. Other: Note made of a possible additional moderate to severe stenosis involving the distal left subclavian artery (series 1405, image 12). Again, there is some motion artifact through this region, limiting the certainty of this finding. IMPRESSION: MRI HEAD: 1. No acute intracranial infarct or other abnormality. 2. Mild chronic microvascular ischemic disease for age. MRA HEAD: 1. Negative intracranial MRA for large vessel occlusion. No hemodynamically significant or correctable stenosis. 2. 4 mm cavernous right ICA aneurysm. MRA NECK: 1. Motion degraded exam. 2. Focal tortuosity with apparent irregularity involving the proximal right ICA, favored to be artifactual due to motion artifact through this region. No other hemodynamically  significant stenosis about either carotid artery system. 3. Possible short-segment moderate stenosis versus artifact involving the pre foraminal left V1 segment. Vertebral arteries otherwise patent within the neck. 4. Possible additional moderate to severe stenosis involving the distal left subclavian artery as above. Electronically Signed   By: Rise Mu M.D.   On: 01/25/2021 22:17   ECHOCARDIOGRAM COMPLETE BUBBLE STUDY  Result Date: 01/26/2021    ECHOCARDIOGRAM REPORT   Patient Name:   Sandy Ward Date of Exam: 01/26/2021 Medical Rec #:  [id number]    Height:       65.0 in Accession #:    [id number]   Weight:       214.7 lb Date of Birth:  [birth year]   BSA:          2.039 m Patient Age:    64 years     BP:           155/75 mmHg Patient Gender: F            HR:           62 bpm. Exam Location:  Inpatient Procedure: 2D Echo, Cardiac Doppler, Color Doppler and Saline Contrast Bubble            Study Indications:    Stroke  History:        Patient has no prior history of Echocardiogram examinations.                 Risk Factors:Diabetes, Hypertension and Current Smoker.  Sonographer:    Ross Ludwig RDCS (AE) Referring Phys: [id number] CAROLE N HALL IMPRESSIONS  1. AoV not well visualized. Appears calcified with indeterminant number of cusps. Vmax 3.2 m/s, MG 25 mmHG, AVA 1.0 cm2, DI 0.35. All consistent with moderate aortic stenosis. No regurgitation. The aortic valve is calcified. Aortic valve regurgitation is not visualized. Moderate aortic valve stenosis.  2. Agitated saline contrast bubble study was negative, with no evidence of any interatrial shunt.  3. Left ventricular ejection fraction, by estimation, is 65 to 70%. The left ventricle has normal function. The left ventricle has no regional wall motion abnormalities. There is mild concentric left ventricular hypertrophy. Left ventricular diastolic parameters were normal.  4. Right ventricular systolic function is normal. The right ventricular size is  normal. There is normal pulmonary artery systolic pressure. The estimated right ventricular systolic pressure is 32.2 mmHg.  5. The mitral valve is grossly normal. Trivial mitral valve regurgitation. No evidence of mitral stenosis.  6. The inferior vena cava is normal in size with greater than 50% respiratory variability, suggesting right atrial pressure of 3 mmHg. Conclusion(s)/Recommendation(s): No  intracardiac source of embolism detected on this transthoracic study. A transesophageal echocardiogram is recommended to exclude cardiac source of embolism if clinically indicated. FINDINGS  Left Ventricle: Left ventricular ejection fraction, by estimation, is 65 to 70%. The left ventricle has normal function. The left ventricle has no regional wall motion abnormalities. The left ventricular internal cavity size was normal in size. There is  mild concentric left ventricular hypertrophy. Left ventricular diastolic parameters were normal. Right Ventricle: The right ventricular size is normal. No increase in right ventricular wall thickness. Right ventricular systolic function is normal. There is normal pulmonary artery systolic pressure. The tricuspid regurgitant velocity is 2.70 m/s, and  with an assumed right atrial pressure of 3 mmHg, the estimated right ventricular systolic pressure is 32.2 mmHg. Left Atrium: Left atrial size was normal in size. Right Atrium: Right atrial size was normal in size. Pericardium: There is no evidence of pericardial effusion. Mitral Valve: The mitral valve is grossly normal. Mild mitral annular calcification. Trivial mitral valve regurgitation. No evidence of mitral valve stenosis. MV peak gradient, 4.3 mmHg. The mean mitral valve gradient is 2.0 mmHg. Tricuspid Valve: The tricuspid valve is grossly normal. Tricuspid valve regurgitation is mild . No evidence of tricuspid stenosis. Aortic Valve: AoV not well visualized. Appears calcified with indeterminant number of cusps. Vmax 3.2 m/s, MG  25 mmHG, AVA 1.0 cm2, DI 0.35. All consistent with moderate aortic stenosis. No regurgitation. The aortic valve is calcified. Aortic valve regurgitation is not visualized. Moderate aortic stenosis is present. Aortic valve mean gradient measures 25.0 mmHg. Aortic valve peak gradient measures 42.2 mmHg. Aortic valve area, by VTI measures 1.00 cm. Pulmonic Valve: The pulmonic valve was grossly normal. Pulmonic valve regurgitation is not visualized. No evidence of pulmonic stenosis. Aorta: The aortic root is normal in size and structure. Venous: The inferior vena cava is normal in size with greater than 50% respiratory variability, suggesting right atrial pressure of 3 mmHg. IAS/Shunts: The atrial septum is grossly normal. Agitated saline contrast was given intravenously to evaluate for intracardiac shunting. Agitated saline contrast bubble study was negative, with no evidence of any interatrial shunt.  LEFT VENTRICLE PLAX 2D LVIDd:         4.70 cm  Diastology LVIDs:         3.40 cm  LV e' medial:    10.80 cm/s LV PW:         1.20 cm  LV E/e' medial:  7.6 LV IVS:        1.20 cm  LV e' lateral:   11.70 cm/s LVOT diam:     1.90 cm  LV E/e' lateral: 7.0 LV SV:         78 LV SV Index:   38 LVOT Area:     2.84 cm  RIGHT VENTRICLE             IVC RV Basal diam:  2.90 cm     IVC diam: 2.00 cm RV S prime:     11.20 cm/s TAPSE (M-mode): 3.1 cm LEFT ATRIUM             Index       RIGHT ATRIUM           Index LA diam:        4.00 cm 1.96 cm/m  RA Area:     21.60 cm LA Vol (A2C):   68.1 ml 33.40 ml/m RA Volume:   65.70 ml  32.23 ml/m LA Vol (A4C):  55.8 ml 27.37 ml/m LA Biplane Vol: 62.6 ml 30.71 ml/m  AORTIC VALVE AV Area (Vmax):    0.99 cm AV Area (Vmean):   0.97 cm AV Area (VTI):     1.00 cm AV Vmax:           325.00 cm/s AV Vmean:          230.500 cm/s AV VTI:            0.779 m AV Peak Grad:      42.2 mmHg AV Mean Grad:      25.0 mmHg LVOT Vmax:         114.00 cm/s LVOT Vmean:        79.100 cm/s LVOT VTI:           0.275 m LVOT/AV VTI ratio: 0.35  AORTA Ao Root diam: 3.30 cm MITRAL VALVE               TRICUSPID VALVE MV Area (PHT): 4.39 cm    TR Peak grad:   29.2 mmHg MV Peak grad:  4.3 mmHg    TR Vmax:        270.00 cm/s MV Mean grad:  2.0 mmHg MV Vmax:       1.04 m/s    SHUNTS MV Vmean:      61.7 cm/s   Systemic VTI:  0.28 m MV Decel Time: 173 msec    Systemic Diam: 1.90 cm MV E velocity: 82.30 cm/s MV A velocity: 71.80 cm/s MV E/A ratio:  1.15 Lennie OdorWesley O'Neal MD Electronically signed by Lennie OdorWesley O'Neal MD Signature Date/Time: 01/26/2021/11:58:10 AM    Final    CT ANGIO HEAD NECK W WO CM (CODE STROKE)  Result Date: 01/26/2021 CLINICAL DATA:  Stroke follow-up. EXAM: CT ANGIOGRAPHY HEAD AND NECK TECHNIQUE: Multidetector CT imaging of the head and neck was performed using the standard protocol during bolus administration of intravenous contrast. Multiplanar CT image reconstructions and MIPs were obtained to evaluate the vascular anatomy. Carotid stenosis measurements (when applicable) are obtained utilizing NASCET criteria, using the distal internal carotid diameter as the denominator. CONTRAST:  100mL OMNIPAQUE IOHEXOL 350 MG/ML SOLN COMPARISON:  MRI from yesterday. FINDINGS: CT HEAD FINDINGS Brain: No evidence of acute large vascular territory infarction, hemorrhage, hydrocephalus, extra-axial collection or mass lesion/mass effect. Vascular: See below Skull: No acute fracture. Sinuses/Orbits: Clear sinuses.  Unremarkable orbits. Other: No mastoid effusions. CTA NECK FINDINGS Aortic arch: Patent great vessel origins.  Atherosclerosis. Right carotid system: Moderate predominately calcific atherosclerosis at the carotid bifurcation without greater than 50% narrowing. Left carotid system: Mixed calcific and noncalcific atherosclerosis at the carotid bifurcation without greater than 50% stenosis. Vertebral arteries: Left dominant no significant (greater than 50%) stenosis. The non dominant right vertebral artery is small  throughout its course. Skeleton: Moderate multilevel degenerative disc disease and facet arthropathy. Other neck: Prominent tonsillar tissue bilaterally without evidence of acute abnormality. Upper chest: Visualized lung apices are clear. 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 Bilateral cavernous and paraclinoid calcific atherosclerosis without greater than 50% narrowing. Bilateral MCAs and ACAs are patent without proximal hemodynamically significant stenosis. Posterior circulation: Small vertebrobasilar system with prominent bilateral posterior communicating arteries, anatomic variant. Bilateral intradural vertebral arteries, basilar artery and PCAs are patent without proximal hemodynamically significant stenosis. No aneurysm identified. Venous sinuses: As permitted by contrast timing, patent. Review of the MIP images confirms the above findings IMPRESSION: CT  head: No evidence of acute intracranial abnormality. CTA head: 1. No large vessel occlusion or hemodynamically significant proximal stenosis. 2. 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 CTA neck: Moderate bilateral carotid bifurcation atherosclerosis without greater than 50% stenosis. Electronically Signed   By: Feliberto Harts MD   On: 01/26/2021 11:20      Discharge Exam: Vitals:   01/26/21 0357 01/26/21 1207  BP: (!) 155/75 (!) 171/67  Pulse: 62 72  Resp: 13 16  Temp: 98.3 F (36.8 C) 98.4 F (36.9 C)  SpO2: 97% 100%   Vitals:   01/25/21 1845 01/25/21 2100 01/26/21 0357 01/26/21 1207  BP: 124/71 (!) 152/67 (!) 155/75 (!) 171/67  Pulse: 66 70 62 72  Resp: 17 17 13 16   Temp:  98.3 F (36.8 C) 98.3 F (36.8 C) 98.4 F (36.9 C)  TempSrc:  Oral Oral Oral  SpO2: 96% 100% 97% 100%  Weight:  97.4 kg    Height:  5\' 5"  (1.651 m)      General: Pt is alert, awake, not in acute distress Cardiovascular: RRR,  S1/S2 +, no rubs, no gallops Respiratory: CTA bilaterally, no wheezing, no rhonchi Abdominal: Soft, NT, ND, bowel sounds + Extremities: no edema, no cyanosis    The results of significant diagnostics from this hospitalization (including imaging, microbiology, ancillary and laboratory) are listed below for reference.     Microbiology: Recent Results (from the past 240 hour(s))  SARS CORONAVIRUS 2 (TAT 6-24 HRS) Nasopharyngeal Nasopharyngeal Swab     Status: None   Collection Time: 01/25/21  7:26 PM   Specimen: Nasopharyngeal Swab  Result Value Ref Range Status   SARS Coronavirus 2 NEGATIVE NEGATIVE Final    Comment: (NOTE) SARS-CoV-2 target nucleic acids are NOT DETECTED.  The SARS-CoV-2 RNA is generally detectable in upper and lower respiratory specimens during the acute phase of infection. Negative results do not preclude SARS-CoV-2 infection, do not rule out co-infections with other pathogens, and should not be used as the sole basis for treatment or other patient management decisions. Negative results must be combined with clinical observations, patient history, and epidemiological information. The expected result is Negative.  Fact Sheet for Patients: HairSlick.no  Fact Sheet for Healthcare Providers: quierodirigir.com  This test is not yet approved or cleared by the [place] FDA and  has been authorized for detection and/or diagnosis of SARS-CoV-2 by FDA under an Emergency Use Authorization (EUA). This EUA will remain  in effect (meaning this test can be used) for the duration of the COVID-19 declaration under Se ction 564(b)(1) of the Act, 21 U.S.C. section 360bbb-3(b)(1), unless the authorization is terminated or revoked sooner.  Performed at [facility], [address], [place], [place] [address]      Labs: BNP (last 3 results) No results for input(s): BNP in the last 8760 hours. Basic  Metabolic Panel: Recent Labs  Lab 01/25/21 1751 01/26/21 0312  NA 138 139  K 3.9 3.4*  CL 102 105  CO2 28 28  GLUCOSE 118* 111*  BUN 19 19  CREATININE 0.87 0.69  CALCIUM 9.1 8.9  MG  --  2.0  PHOS  --  4.5   Liver Function Tests: Recent Labs  Lab 01/25/21 1751  AST 17  ALT 18  ALKPHOS 72  BILITOT 0.8  PROT 7.6  ALBUMIN 3.9   No results for input(s): LIPASE, AMYLASE in the last 168 hours. No results for input(s): AMMONIA in the last 168 hours. CBC: Recent Labs  Lab 01/25/21 1751 01/26/21 0312  WBC 11.2* 8.5  NEUTROABS 7.5  --   HGB 14.0 12.5  HCT 42.1 37.4  MCV 86.3 86.0  PLT 273 234   Cardiac Enzymes: No results for input(s): CKTOTAL, CKMB, CKMBINDEX, TROPONINI in the last 168 hours. BNP: Invalid input(s): POCBNP CBG: Recent Labs  Lab 01/25/21 1751 01/25/21 2147 01/26/21 0609 01/26/21 1106  GLUCAP 112* 90 107* 77   D-Dimer No results for input(s): DDIMER in the last 72 hours. Hgb A1c Recent Labs    01/26/21 0312  HGBA1C 6.0*   Lipid Profile Recent Labs    01/26/21 0312  CHOL 200  HDL 27*  LDLCALC 134*  TRIG 196*  CHOLHDL 7.4   Thyroid function studies No results for input(s): TSH, T4TOTAL, T3FREE, THYROIDAB in the last 72 hours.  Invalid input(s): FREET3 Anemia work up No results for input(s): VITAMINB12, FOLATE, FERRITIN, TIBC, IRON, RETICCTPCT in the last 72 hours. Urinalysis    Component Value Date/Time   COLORURINE YELLOW 01/25/2021 1920   APPEARANCEUR HAZY (A) 01/25/2021 1920   LABSPEC 1.016 01/25/2021 1920   PHURINE 5.0 01/25/2021 1920   GLUCOSEU NEGATIVE 01/25/2021 1920   HGBUR SMALL (A) 01/25/2021 1920   BILIRUBINUR NEGATIVE 01/25/2021 1920   KETONESUR NEGATIVE 01/25/2021 1920   PROTEINUR NEGATIVE 01/25/2021 1920   NITRITE NEGATIVE 01/25/2021 1920   LEUKOCYTESUR SMALL (A) 01/25/2021 1920   Sepsis Labs Invalid input(s): PROCALCITONIN,  WBC,  LACTICIDVEN Microbiology Recent Results (from the past 240 hour(s))  SARS  CORONAVIRUS 2 (TAT 6-24 HRS) Nasopharyngeal Nasopharyngeal Swab     Status: None   Collection Time: 01/25/21  7:26 PM   Specimen: Nasopharyngeal Swab  Result Value Ref Range Status   SARS Coronavirus 2 NEGATIVE NEGATIVE Final    Comment: (NOTE) SARS-CoV-2 target nucleic acids are NOT DETECTED.  The SARS-CoV-2 RNA is generally detectable in upper and lower respiratory specimens during the acute phase of infection. Negative results do not preclude SARS-CoV-2 infection, do not rule out co-infections with other pathogens, and should not be used as the sole basis for treatment or other patient management decisions. Negative results must be combined with clinical observations, patient history, and epidemiological information. The expected result is Negative.  Fact Sheet for Patients: HairSlick.no  Fact Sheet for Healthcare Providers: quierodirigir.com  This test is not yet approved or cleared by the [place] FDA and  has been authorized for detection and/or diagnosis of SARS-CoV-2 by FDA under an Emergency Use Authorization (EUA). This EUA will remain  in effect (meaning this test can be used) for the duration of the COVID-19 declaration under Se ction 564(b)(1) of the Act, 21 U.S.C. section 360bbb-3(b)(1), unless the authorization is terminated or revoked sooner.  Performed at [facility], [address] [place], [place] [address]      Time coordinating discharge: 45 minutes  SIGNED:   Verdia Kuba, MD  [facility] 01/26/2021, 6:43 PM Pager   If 7PM-7AM, please contact night-coverage [email or web address] Password [email or web address]

## 2021-01-26 NOTE — Evaluation (Signed)
Physical Therapy Evaluation Patient Details Name: Sandy Ward MRN: [id number] DOB: 12/30/1955 Today's Date: 01/26/2021   History of Present Illness  65yo female admitted 01/25/21 after being referred to the ED by her opthamologist with L eye lateral vision loss for further work up of possible retinal artery occlusion and CVA rule-out. PMH DM, HTN  Clinical Impression   Patient received in bed, very pleasant and cooperative. Able to mobilize on an independent basis with no device, no significant LOB or unsteadiness noted. Does continue to have lateral vision loss in the left eye- tracking intact, but peripheral vision is most affected. Discussed and practiced compensation strategies including scanning environment. Left in bed with all needs met, spouse present. No need for skilled PT services, signing off- thank you for the referral!     Follow Up Recommendations No PT follow up    Equipment Recommendations  None recommended by PT    Recommendations for Other Services       Precautions / Restrictions Precautions Precautions: Other (comment) Precaution Comments: lateral vision loss left eye Restrictions Weight Bearing Restrictions: No      Mobility  Bed Mobility Overal bed mobility: Independent                  Transfers Overall transfer level: Independent Equipment used: None                Ambulation/Gait Ambulation/Gait assistance: Independent Gait Distance (Feet): 160 Feet Assistive device: None Gait Pattern/deviations: WFL(Within Functional Limits);Step-through pattern Gait velocity: WNL   General Gait Details: gait pattern WNL, no physical assist given, occasional cues to scan to the left to compensate for left eye lateral vision loss  Stairs Stairs: Yes Stairs assistance: Supervision Stair Management: One rail Right;Forwards;Alternating pattern Number of Stairs: 2 General stair comments: no physical assist given, no difficulty noted  Wheelchair  Mobility    Modified Rankin (Stroke Patients Only)       Balance Overall balance assessment: Independent                                           Pertinent Vitals/Pain Pain Assessment: No/denies pain    Home Living Family/patient expects to be discharged to:: Private residence Living Arrangements: Spouse/significant other Available Help at Discharge: Family;Neighbor;Available 24 hours/day Type of Home: House Home Access: Stairs to enter Entrance Stairs-Rails: Right Entrance Stairs-Number of Steps: 2 with R ascending rail Home Layout: Able to live on main level with bedroom/bathroom;Two level Home Equipment: Walker - 2 wheels;Walker - 4 wheels;Cane - single point Additional Comments: husband just got hip replacement a couple of weeks ago, doing well    Prior Function Level of Independence: Independent               Hand Dominance        Extremity/Trunk Assessment   Upper Extremity Assessment Upper Extremity Assessment: Defer to OT evaluation    Lower Extremity Assessment Lower Extremity Assessment: Overall WFL for tasks assessed    Cervical / Trunk Assessment Cervical / Trunk Assessment: Normal  Communication   Communication: No difficulties  Cognition Arousal/Alertness: Awake/alert Behavior During Therapy: WFL for tasks assessed/performed Overall Cognitive Status: Within Functional Limits for tasks assessed  General Comments      Exercises     Assessment/Plan    PT Assessment Patent does not need any further PT services  PT Problem List Decreased strength       PT Treatment Interventions Patient/family education    PT Goals (Current goals can be found in the Care Plan section)  Acute Rehab PT Goals Patient Stated Goal: go home PT Goal Formulation: With patient Time For Goal Achievement: 02/09/21 Potential to Achieve Goals: Good    Frequency Other (Comment) (Eval  only)   Barriers to discharge        Co-evaluation               AM-PAC PT "6 Clicks" Mobility  Outcome Measure Help needed turning from your back to your side while in a flat bed without using bedrails?: None Help needed moving from lying on your back to sitting on the side of a flat bed without using bedrails?: None Help needed moving to and from a bed to a chair (including a wheelchair)?: None Help needed standing up from a chair using your arms (e.g., wheelchair or bedside chair)?: None Help needed to walk in hospital room?: None Help needed climbing 3-5 steps with a railing? : None 6 Click Score: 24    End of Session   Activity Tolerance: Patient tolerated treatment well Patient left: in bed;with call bell/phone within reach;with family/visitor present Nurse Communication: Mobility status PT Visit Diagnosis: Muscle weakness (generalized) (M62.81)    Time: 3785-8850 PT Time Calculation (min) (ACUTE ONLY): 15 min   Charges:   PT Evaluation $PT Eval Low Complexity: 1 Low         Windell Norfolk, DPT, PN1   Supplemental Physical Therapist [place]    Pager [phone] Acute Rehab Office [phone]

## 2021-01-26 NOTE — Discharge Instructions (Addendum)
1) f/u with PCP in 1 week 2) f/u with Dr. Pearlean Brownie, neurology in 6 weeks 3) Take aspirin for 3 weeks then stop. Take plavix daily 4) your crestor was increased from 20mg  to 40mg  4) no driving until follow up with opthalmology

## 2021-01-26 NOTE — Progress Notes (Signed)
Discharge instructions reviewed and given to pt and husband, both acknowledge understanding.

## 2021-01-26 NOTE — Progress Notes (Signed)
Occupational Therapy Note  Pt with impaired L visual field. Pt reports reading is "about the same", but notices some differences. Educated pt on compensatory strategies for low vision by improving lighting, increasing contrast and reducing clutter. Educated on strategies to reduce risk of falls. Recommend pt follow up with her eye doctor for a full visual field assessment. Pt verbalized understanding. No further OT needs.    01/26/21 1300  OT Visit Information  Last OT Received On 01/26/21  Assistance Needed +1  History of Present Illness 65yo female admitted 01/25/21 after being referred to the ED by her opthamologist with L eye lateral vision loss for further work up of possible retinal artery occlusion and CVA rule-out. PMH DM, HTN  Precautions  Precautions None  Home Living  Family/patient expects to be discharged to: Private residence  Living Arrangements Spouse/significant other  Available Help at Discharge Family;Neighbor;Available 24 hours/day  Type of Home House  Home Access Stairs to enter  Entrance Stairs-Number of Steps 2 with R ascending rail  Entrance Stairs-Rails Right  Home Layout Able to live on main level with bedroom/bathroom;Two level  Bathroom Forensic scientist - 2 wheels;Walker - 4 wheels;Cane - single point  Additional Comments husband just got hip replacement a couple of weeks ago, doing well  Prior Function  Level of Independence Independent  Communication  Communication No difficulties  Pain Assessment  Pain Assessment No/denies pain  Cognition  Arousal/Alertness Awake/alert  Behavior During Therapy WFL for tasks assessed/performed  Overall Cognitive Status Within Functional Limits for tasks assessed  Upper Extremity Assessment  Upper Extremity Assessment Overall WFL for tasks assessed  ADL  Overall ADL's  At baseline  Vision- History  Baseline Vision/History Wears glasses  Wears  Glasses At all times  Patient Visual Report Blurring of vision  Vision- Assessment  Vision Assessment? Yes  Eye Alignment WFL  Ocular Range of Motion [facility]  Tracking/Visual Pursuits Able to track stimulus in all quads without difficulty  Saccades WFL  Convergence WFL  Visual Fields Left visual field deficit  Bed Mobility  Overal bed mobility Independent  Transfers  Overall transfer level Independent  OT - End of Session  Activity Tolerance Patient tolerated treatment well  Patient left in bed;with call bell/phone within reach;with family/visitor present  Nurse Communication Mobility status  OT Assessment  OT Recommendation/Assessment Patient does not need any further OT services  OT Visit Diagnosis Low vision, both eyes (H54.2)  OT Problem List Impaired vision/perception  AM-PAC OT "6 Clicks" Daily Activity Outcome Measure (Version 2)  Help from another person eating meals? 4  Help from another person taking care of personal grooming? 4  Help from another person toileting, which includes using toliet, bedpan, or urinal? 4  Help from another person bathing (including washing, rinsing, drying)? 4  Help from another person to put on and taking off regular upper body clothing? 4  Help from another person to put on and taking off regular lower body clothing? 4  6 Click Score 24  OT Recommendation  Follow Up Recommendations Other (comment) (follow up with eye doctor for a Full Field Visual Assessment)  OT Equipment None recommended by OT  Acute Rehab OT Goals  Patient Stated Goal go home  OT Goal Formulation All assessment and education complete, DC therapy  OT Time Calculation  OT Start Time (ACUTE ONLY) 1253  OT Stop Time (ACUTE ONLY) 1315  OT Time Calculation (min) 22 min  OT  General Charges  $OT Visit 1 Visit  OT Evaluation  $OT Eval Low Complexity 1 Low  Written Expression  Dominant Hand Right   Luisa Dago, OT/L   Acute OT Clinical Specialist Acute Rehabilitation  Services Pager [phone] Office [phone]

## 2021-01-26 NOTE — Progress Notes (Signed)
  Echocardiogram 2D Echocardiogram has been performed.  Sandy Ward 01/26/2021, 10:11 AM

## 2021-01-26 NOTE — Progress Notes (Addendum)
STROKE TEAM PROGRESS NOTE   INTERVAL HISTORY Her husband  is at the bedside.  Her partial monocular vision loss remains the same. Reviewed stroke risk factors.   Vitals:   01/25/21 1800 01/25/21 1845 01/25/21 2100 01/26/21 0357  BP: 137/74 124/71 (!) 152/67 (!) 155/75  Pulse: 73 66 70 62  Resp: 14 17 17 13   Temp: 98.2 F (36.8 C)  98.3 F (36.8 C) 98.3 F (36.8 C)  TempSrc: Oral  Oral Oral  SpO2: 96% 96% 100% 97%  Weight:   97.4 kg   Height:   5' 5"  (1.651 m)    CBC:  Recent Labs  Lab 01/25/21 1751 01/26/21 0312  WBC 11.2* 8.5  NEUTROABS 7.5  --   HGB 14.0 12.5  HCT 42.1 37.4  MCV 86.3 86.0  PLT 273 657   Basic Metabolic Panel:  Recent Labs  Lab 01/25/21 1751 01/26/21 0312  NA 138 139  K 3.9 3.4*  CL 102 105  CO2 28 28  GLUCOSE 118* 111*  BUN 19 19  CREATININE 0.87 0.69  CALCIUM 9.1 8.9  MG  --  2.0  PHOS  --  4.5    Lipid Panel:  Recent Labs  Lab 01/26/21 0312  CHOL 200  TRIG 196*  HDL 27*  CHOLHDL 7.4  VLDL 39  LDLCALC 134*    HgbA1c:  Recent Labs  Lab 01/26/21 0312  HGBA1C 6.0*   Urine Drug Screen:  Recent Labs  Lab 01/25/21 1920  LABOPIA NONE DETECTED  COCAINSCRNUR NONE DETECTED  LABBENZ NONE DETECTED  AMPHETMU NONE DETECTED  THCU NONE DETECTED  LABBARB NONE DETECTED    Alcohol Level  Recent Labs  Lab 01/25/21 1751  ETH <10    IMAGING past 24 hours MR ANGIO HEAD WO CONTRAST  Result Date: 01/25/2021 CLINICAL DATA:  Initial evaluation for neuro deficit, stroke, TIA. EXAM: MRI HEAD WITHOUT CONTRAST MRA HEAD WITHOUT CONTRAST MRA NECK WITHOUT AND WITH CONTRAST TECHNIQUE: Multiplanar, multi-echo pulse sequences of the brain and surrounding structures were acquired without intravenous contrast. Angiographic images of the Circle of Willis were acquired using MRA technique without intravenous contrast. Angiographic images of the neck were acquired using MRA technique without and with intravenous contrast. Carotid stenosis measurements  (when applicable) are obtained utilizing NASCET criteria, using the distal internal carotid diameter as the denominator. CONTRAST:  47m GADAVIST GADOBUTROL 1 MMOL/ML IV SOLN COMPARISON:  None available. FINDINGS: MRI HEAD FINDINGS Brain: Cerebral volume within normal limits for age. Scattered patchy T2/FLAIR hyperintensity seen within the periventricular and deep white matter both cerebral hemispheres as well as the pons, nonspecific, but most likely related chronic microvascular ischemic disease, mild in nature. No abnormal foci of restricted diffusion to suggest acute or subacute ischemia. Gray-white matter differentiation maintained. No encephalomalacia to suggest chronic cortical infarction. No evidence for acute or chronic intracranial hemorrhage. No mass lesion, midline shift or mass effect. No hydrocephalus or extra-axial fluid collection. Pituitary gland suprasellar region within normal limits. Midline structures intact and normal. Vascular: Major intracranial vascular flow voids are maintained. Skull and upper cervical spine: Craniocervical junction within normal limits. Bone marrow signal intensity normal. No scalp soft tissue abnormality. Sinuses/Orbits: Globes and orbital soft tissues within normal limits. Paranasal sinuses are clear. Trace left mastoid effusion noted, of doubtful significance. Inner ear structures grossly normal. Other: None. MRA HEAD FINDINGS Anterior circulation: Visualized distal cervical segments of the internal carotid arteries are patent with antegrade flow. Petrous, cavernous, and supraclinoid segments patent without stenosis. 4  mm outpouching extending laterally from the cavernous segment of the right ICA consistent with an aneurysm (series 4, image 77). A1 segments patent bilaterally. Left A1 partially fenestrated/duplicated. Normal anterior communicating artery complex. Anterior cerebral arteries patent to their distal aspects without stenosis. No M1 stenosis or occlusion.  Normal MCA bifurcations. Distal MCA branches well perfused and symmetric. Posterior circulation: Left vertebral artery dominant and widely patent to the vertebrobasilar junction. Right vertebral artery hypoplastic and largely terminates in PICA, although a tiny branch ascending towards the vertebrobasilar junction on time-of-flight sequence. Both PICA origins patent and normal. Basilar patent to its distal aspect without stenosis. Superior cerebral arteries patent bilaterally. PCA supplied via the basilar as well as robust bilateral posterior communicating arteries. PCAs well perfused to their distal aspects without stenosis. Anatomic variants: Hypoplastic right vertebral artery largely terminates in PICA. MRA NECK FINDINGS Aortic arch: Examination degraded by motion artifact. Visualized aortic arch normal caliber with normal 3 vessel morphology. No hemodynamically significant stenosis seen about the origin of the great vessels. Right carotid system: Right CCA patent from its origin to the bifurcation without stenosis. No significant atheromatous narrowing about the right carotid bulb. Focal tortuosity with apparent irregularity involving the proximal right ICA noted, favored to be artifactual due to motion artifact through this region (series 1405, image 8). The right ICA appears to be patent without significant stenosis at this level on corresponding time-of-flight sequence. Right ICA patent distally to the skull base without stenosis, evidence for dissection, or occlusion. Left carotid system: Left CCA patent from its origin to the bifurcation without stenosis. Left carotid bifurcation somewhat low lying in the neck. No significant atheromatous narrowing about the left carotid bulb. Left ICA patent distally without stenosis, evidence for dissection or occlusion. Vertebral arteries: Both vertebral arteries arise from the subclavian arteries. No proximal subclavian artery stenosis. Left vertebral artery dominant.  Possible short-segment moderate stenosis versus artifact noted involving the pre foraminal left V1 segment (series 1408, image 7). Vertebral arteries otherwise patent within the neck without stenosis, evidence for dissection or occlusion. Other: Note made of a possible additional moderate to severe stenosis involving the distal left subclavian artery (series 1405, image 12). Again, there is some motion artifact through this region, limiting the certainty of this finding. IMPRESSION: MRI HEAD: 1. No acute intracranial infarct or other abnormality. 2. Mild chronic microvascular ischemic disease for age. MRA HEAD: 1. Negative intracranial MRA for large vessel occlusion. No hemodynamically significant or correctable stenosis. 2. 4 mm cavernous right ICA aneurysm. MRA NECK: 1. Motion degraded exam. 2. Focal tortuosity with apparent irregularity involving the proximal right ICA, favored to be artifactual due to motion artifact through this region. No other hemodynamically significant stenosis about either carotid artery system. 3. Possible short-segment moderate stenosis versus artifact involving the pre foraminal left V1 segment. Vertebral arteries otherwise patent within the neck. 4. Possible additional moderate to severe stenosis involving the distal left subclavian artery as above. Electronically Signed   By: Jeannine Boga M.D.   On: 01/25/2021 22:17   MR Angiogram Neck W or Wo Contrast  Result Date: 01/25/2021 CLINICAL DATA:  Initial evaluation for neuro deficit, stroke, TIA. EXAM: MRI HEAD WITHOUT CONTRAST MRA HEAD WITHOUT CONTRAST MRA NECK WITHOUT AND WITH CONTRAST TECHNIQUE: Multiplanar, multi-echo pulse sequences of the brain and surrounding structures were acquired without intravenous contrast. Angiographic images of the Circle of Willis were acquired using MRA technique without intravenous contrast. Angiographic images of the neck were acquired using MRA technique without and with  intravenous  contrast. Carotid stenosis measurements (when applicable) are obtained utilizing NASCET criteria, using the distal internal carotid diameter as the denominator. CONTRAST:  22m GADAVIST GADOBUTROL 1 MMOL/ML IV SOLN COMPARISON:  None available. FINDINGS: MRI HEAD FINDINGS Brain: Cerebral volume within normal limits for age. Scattered patchy T2/FLAIR hyperintensity seen within the periventricular and deep white matter both cerebral hemispheres as well as the pons, nonspecific, but most likely related chronic microvascular ischemic disease, mild in nature. No abnormal foci of restricted diffusion to suggest acute or subacute ischemia. Gray-white matter differentiation maintained. No encephalomalacia to suggest chronic cortical infarction. No evidence for acute or chronic intracranial hemorrhage. No mass lesion, midline shift or mass effect. No hydrocephalus or extra-axial fluid collection. Pituitary gland suprasellar region within normal limits. Midline structures intact and normal. Vascular: Major intracranial vascular flow voids are maintained. Skull and upper cervical spine: Craniocervical junction within normal limits. Bone marrow signal intensity normal. No scalp soft tissue abnormality. Sinuses/Orbits: Globes and orbital soft tissues within normal limits. Paranasal sinuses are clear. Trace left mastoid effusion noted, of doubtful significance. Inner ear structures grossly normal. Other: None. MRA HEAD FINDINGS Anterior circulation: Visualized distal cervical segments of the internal carotid arteries are patent with antegrade flow. Petrous, cavernous, and supraclinoid segments patent without stenosis. 4 mm outpouching extending laterally from the cavernous segment of the right ICA consistent with an aneurysm (series 4, image 77). A1 segments patent bilaterally. Left A1 partially fenestrated/duplicated. Normal anterior communicating artery complex. Anterior cerebral arteries patent to their distal aspects without  stenosis. No M1 stenosis or occlusion. Normal MCA bifurcations. Distal MCA branches well perfused and symmetric. Posterior circulation: Left vertebral artery dominant and widely patent to the vertebrobasilar junction. Right vertebral artery hypoplastic and largely terminates in PICA, although a tiny branch ascending towards the vertebrobasilar junction on time-of-flight sequence. Both PICA origins patent and normal. Basilar patent to its distal aspect without stenosis. Superior cerebral arteries patent bilaterally. PCA supplied via the basilar as well as robust bilateral posterior communicating arteries. PCAs well perfused to their distal aspects without stenosis. Anatomic variants: Hypoplastic right vertebral artery largely terminates in PICA. MRA NECK FINDINGS Aortic arch: Examination degraded by motion artifact. Visualized aortic arch normal caliber with normal 3 vessel morphology. No hemodynamically significant stenosis seen about the origin of the great vessels. Right carotid system: Right CCA patent from its origin to the bifurcation without stenosis. No significant atheromatous narrowing about the right carotid bulb. Focal tortuosity with apparent irregularity involving the proximal right ICA noted, favored to be artifactual due to motion artifact through this region (series 1405, image 8). The right ICA appears to be patent without significant stenosis at this level on corresponding time-of-flight sequence. Right ICA patent distally to the skull base without stenosis, evidence for dissection, or occlusion. Left carotid system: Left CCA patent from its origin to the bifurcation without stenosis. Left carotid bifurcation somewhat low lying in the neck. No significant atheromatous narrowing about the left carotid bulb. Left ICA patent distally without stenosis, evidence for dissection or occlusion. Vertebral arteries: Both vertebral arteries arise from the subclavian arteries. No proximal subclavian artery  stenosis. Left vertebral artery dominant. Possible short-segment moderate stenosis versus artifact noted involving the pre foraminal left V1 segment (series 1408, image 7). Vertebral arteries otherwise patent within the neck without stenosis, evidence for dissection or occlusion. Other: Note made of a possible additional moderate to severe stenosis involving the distal left subclavian artery (series 1405, image 12). Again, there is some motion artifact through  this region, limiting the certainty of this finding. IMPRESSION: MRI HEAD: 1. No acute intracranial infarct or other abnormality. 2. Mild chronic microvascular ischemic disease for age. MRA HEAD: 1. Negative intracranial MRA for large vessel occlusion. No hemodynamically significant or correctable stenosis. 2. 4 mm cavernous right ICA aneurysm. MRA NECK: 1. Motion degraded exam. 2. Focal tortuosity with apparent irregularity involving the proximal right ICA, favored to be artifactual due to motion artifact through this region. No other hemodynamically significant stenosis about either carotid artery system. 3. Possible short-segment moderate stenosis versus artifact involving the pre foraminal left V1 segment. Vertebral arteries otherwise patent within the neck. 4. Possible additional moderate to severe stenosis involving the distal left subclavian artery as above. Electronically Signed   By: Jeannine Boga M.D.   On: 01/25/2021 22:17   MR BRAIN WO CONTRAST  Result Date: 01/25/2021 CLINICAL DATA:  Initial evaluation for neuro deficit, stroke, TIA. EXAM: MRI HEAD WITHOUT CONTRAST MRA HEAD WITHOUT CONTRAST MRA NECK WITHOUT AND WITH CONTRAST TECHNIQUE: Multiplanar, multi-echo pulse sequences of the brain and surrounding structures were acquired without intravenous contrast. Angiographic images of the Circle of Willis were acquired using MRA technique without intravenous contrast. Angiographic images of the neck were acquired using MRA technique without  and with intravenous contrast. Carotid stenosis measurements (when applicable) are obtained utilizing NASCET criteria, using the distal internal carotid diameter as the denominator. CONTRAST:  65m GADAVIST GADOBUTROL 1 MMOL/ML IV SOLN COMPARISON:  None available. FINDINGS: MRI HEAD FINDINGS Brain: Cerebral volume within normal limits for age. Scattered patchy T2/FLAIR hyperintensity seen within the periventricular and deep white matter both cerebral hemispheres as well as the pons, nonspecific, but most likely related chronic microvascular ischemic disease, mild in nature. No abnormal foci of restricted diffusion to suggest acute or subacute ischemia. Gray-white matter differentiation maintained. No encephalomalacia to suggest chronic cortical infarction. No evidence for acute or chronic intracranial hemorrhage. No mass lesion, midline shift or mass effect. No hydrocephalus or extra-axial fluid collection. Pituitary gland suprasellar region within normal limits. Midline structures intact and normal. Vascular: Major intracranial vascular flow voids are maintained. Skull and upper cervical spine: Craniocervical junction within normal limits. Bone marrow signal intensity normal. No scalp soft tissue abnormality. Sinuses/Orbits: Globes and orbital soft tissues within normal limits. Paranasal sinuses are clear. Trace left mastoid effusion noted, of doubtful significance. Inner ear structures grossly normal. Other: None. MRA HEAD FINDINGS Anterior circulation: Visualized distal cervical segments of the internal carotid arteries are patent with antegrade flow. Petrous, cavernous, and supraclinoid segments patent without stenosis. 4 mm outpouching extending laterally from the cavernous segment of the right ICA consistent with an aneurysm (series 4, image 77). A1 segments patent bilaterally. Left A1 partially fenestrated/duplicated. Normal anterior communicating artery complex. Anterior cerebral arteries patent to their  distal aspects without stenosis. No M1 stenosis or occlusion. Normal MCA bifurcations. Distal MCA branches well perfused and symmetric. Posterior circulation: Left vertebral artery dominant and widely patent to the vertebrobasilar junction. Right vertebral artery hypoplastic and largely terminates in PICA, although a tiny branch ascending towards the vertebrobasilar junction on time-of-flight sequence. Both PICA origins patent and normal. Basilar patent to its distal aspect without stenosis. Superior cerebral arteries patent bilaterally. PCA supplied via the basilar as well as robust bilateral posterior communicating arteries. PCAs well perfused to their distal aspects without stenosis. Anatomic variants: Hypoplastic right vertebral artery largely terminates in PICA. MRA NECK FINDINGS Aortic arch: Examination degraded by motion artifact. Visualized aortic arch normal caliber with normal 3 vessel  morphology. No hemodynamically significant stenosis seen about the origin of the great vessels. Right carotid system: Right CCA patent from its origin to the bifurcation without stenosis. No significant atheromatous narrowing about the right carotid bulb. Focal tortuosity with apparent irregularity involving the proximal right ICA noted, favored to be artifactual due to motion artifact through this region (series 1405, image 8). The right ICA appears to be patent without significant stenosis at this level on corresponding time-of-flight sequence. Right ICA patent distally to the skull base without stenosis, evidence for dissection, or occlusion. Left carotid system: Left CCA patent from its origin to the bifurcation without stenosis. Left carotid bifurcation somewhat low lying in the neck. No significant atheromatous narrowing about the left carotid bulb. Left ICA patent distally without stenosis, evidence for dissection or occlusion. Vertebral arteries: Both vertebral arteries arise from the subclavian arteries. No proximal  subclavian artery stenosis. Left vertebral artery dominant. Possible short-segment moderate stenosis versus artifact noted involving the pre foraminal left V1 segment (series 1408, image 7). Vertebral arteries otherwise patent within the neck without stenosis, evidence for dissection or occlusion. Other: Note made of a possible additional moderate to severe stenosis involving the distal left subclavian artery (series 1405, image 12). Again, there is some motion artifact through this region, limiting the certainty of this finding. IMPRESSION: MRI HEAD: 1. No acute intracranial infarct or other abnormality. 2. Mild chronic microvascular ischemic disease for age. MRA HEAD: 1. Negative intracranial MRA for large vessel occlusion. No hemodynamically significant or correctable stenosis. 2. 4 mm cavernous right ICA aneurysm. MRA NECK: 1. Motion degraded exam. 2. Focal tortuosity with apparent irregularity involving the proximal right ICA, favored to be artifactual due to motion artifact through this region. No other hemodynamically significant stenosis about either carotid artery system. 3. Possible short-segment moderate stenosis versus artifact involving the pre foraminal left V1 segment. Vertebral arteries otherwise patent within the neck. 4. Possible additional moderate to severe stenosis involving the distal left subclavian artery as above. Electronically Signed   By: Jeannine Boga M.D.   On: 01/25/2021 22:17    PHYSICAL EXAM General: Appears well-developed. Psych: Affect appropriate to situation Eyes: No scleral injection HENT: No OP obstrucion Head: Normocephalic.  Cardiovascular: Normal rate and regular rhythm. Respiratory: Effort normal and breath sounds normal to anterior ascultation GI: Soft.  No distension. There is no tenderness.  Skin: WDI    Neurological Examination Mental Status: Alert, oriented, thought content appropriate.  Speech fluent without evidence of aphasia. Able to follow 3  step commands without difficulty. Cranial Nerves: II: Visual fields grossly normal, No clear VFC, but states there is a small dark crescent shaped black spot in her visual field. III,IV, VI: ptosis not present, extra-ocular motions intact bilaterally, pupils equal, round, reactive to light and accommodation V,VII: smile symmetric, facial light touch sensation normal bilaterally VIII: hearing normal bilaterally IX,X: uvula rises symmetrically XI: bilateral shoulder shrug XII: midline tongue extension Motor: Right : Upper extremity   5/5    Left:     Upper extremity   5/5  Lower extremity   5/5     Lower extremity   5/5 Tone and bulk:normal tone throughout; no atrophy noted Sensory: Pinprick and light touch intact throughout, bilaterally Deep Tendon Reflexes: 2+ and symmetric throughout Plantars: Right: downgoing   Left: downgoing Cerebellar: normal finger-to-nose, normal rapid alternating movements and normal heel-to-shin test Gait: normal gait and station   ASSESSMENT/PLAN [name] is a 65 y.o. 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 No acute stroke noted on MRI. Pt states this has never happened before. No amaurosis fugax reported.   MRI  neg for acute infarct. Partial CARO could be embolic. Could consider further f/u as out pt for loop to identify Afib. Pt wants to dc today, and this could not be done till Monday or Tue.  MRA  possible 21m cavernous RICA aneurysm? No high grade stenosis or heavy plaque 2D Echo 60% EF, mild LVH, no PFO, no LA dilat  LDL 134 HgbA1c 6.0 VTE prophylaxis - lovenox aspirin 81 mg daily prior to admission, now on aspirin 81 mg daily and clopidogrel 75 mg daily. X3 weeks, then monotherapy with Plavix since prev taking ASA daily.  Therapy recommendations:  home Disposition:  home  today from neuro stand point is ok- NO driving till cleared by f/u with ophthalmology.  Hypertension Home meds:  cozaar, hydrodiuril Stable Permissive hypertension (OK if < 220/120) but gradually normalize in 5-7 days Long-term BP goal normotensive  Hyperlipidemia Home meds:  crestor 257m resumed in hospital LDL 134, goal < 70 Increase Crestor to 4059mHigh intensity statin   Continue statin at discharge  Diabetes type II Controlled Home meds:  glucophage HgbA1c 6.0, goal < 7.0 CBGs SSI  Other Stroke Risk Factors None  Hospital day # 0 Pt is doing well and wants to dc home today. NO rehab need identified. She should f/u with [facility] in 4-6 weeks. Out pt consideration for loop placement.   Desiree Metzger-Cihelka, ARNP-C, ANVP-BC Pager: [phone] NOTE: I reviewed above note and agree with the assessment and plan.   [age]ar old female with history of hypertension diabetes, admitted for left eye Hollenhorst plaque.  MRI no acute abnormality.  MRA head and neck and CTA head and neck unremarkable except 4 mm right cavernous ICA aneurysm.  EF 65 to 70%, UDS negative, A1c 6.0, LDL 134.  ESR 17, CRP 0.7.  Etiology of symptoms still concerning for embolic phenomena given funduscopic exam visualized emboli.  Currently no vascular findings, need to rule out A. fib. Recommend 30 day cardiac event monitoring as outpt first, if negative, consider loop recorder.  Continue aspirin 81 Plavix 75 DAPT for 3 weeks and then Plavix alone.  Increase Crestor from 20-40.  For detailed assessment and plan, please refer to above as I have made changes wherever appropriate.   Neurology will sign off. Please call with questions. [facility] Pt will also follow up with interventional radiology for incidental 4mm82mvernous ICA aneurysm. Thanks for the consult.   JindRosalin Hawking PhD Stroke Neurology 01/26/2021 3:29 PM    To contact  Stroke Continuity provider, please refer to [email or web address], contact General Neurology

## 2021-02-13 ENCOUNTER — Ambulatory Visit (INDEPENDENT_AMBULATORY_CARE_PROVIDER_SITE_OTHER): Payer: BC Managed Care – PPO

## 2021-02-13 DIAGNOSIS — I4891 Unspecified atrial fibrillation: Secondary | ICD-10-CM | POA: Diagnosis not present

## 2021-02-13 DIAGNOSIS — H34232 Retinal artery branch occlusion, left eye: Secondary | ICD-10-CM | POA: Diagnosis not present

## 2021-02-13 DIAGNOSIS — H34219 Partial retinal artery occlusion, unspecified eye: Secondary | ICD-10-CM

## 2021-03-05 ENCOUNTER — Encounter: Payer: Self-pay | Admitting: Adult Health

## 2021-03-05 ENCOUNTER — Ambulatory Visit (INDEPENDENT_AMBULATORY_CARE_PROVIDER_SITE_OTHER): Payer: BC Managed Care – PPO | Admitting: Adult Health

## 2021-03-05 VITALS — BP 132/78 | HR 69 | Ht 66.0 in | Wt 217.0 lb

## 2021-03-05 DIAGNOSIS — I671 Cerebral aneurysm, nonruptured: Secondary | ICD-10-CM

## 2021-03-05 DIAGNOSIS — I10 Essential (primary) hypertension: Secondary | ICD-10-CM

## 2021-03-05 DIAGNOSIS — H34232 Retinal artery branch occlusion, left eye: Secondary | ICD-10-CM | POA: Diagnosis not present

## 2021-03-05 DIAGNOSIS — E785 Hyperlipidemia, unspecified: Secondary | ICD-10-CM | POA: Diagnosis not present

## 2021-03-05 DIAGNOSIS — E119 Type 2 diabetes mellitus without complications: Secondary | ICD-10-CM | POA: Diagnosis not present

## 2021-03-05 MED ORDER — EZETIMIBE 10 MG PO TABS: 10.0000 mg | ORAL_TABLET | Freq: Every day | ORAL | 6 refills | Status: AC

## 2021-03-05 MED ORDER — CLOPIDOGREL BISULFATE 75 MG PO TABS: 75.0000 mg | ORAL_TABLET | Freq: Every day | ORAL | 3 refills | Status: AC

## 2021-03-05 NOTE — Progress Notes (Signed)
[facility] [address] [place]. [place] [address] [phone]       HOSPITAL FOLLOW UP NOTE  Sandy Ward Date of Birth:  1956/04/09 Medical Record Number:  [id number]   Reason for Referral:  hospital stroke follow up    SUBJECTIVE:   CHIEF COMPLAINT:  Chief Complaint  Patient presents with   Follow-up    Rm 3 Here for f/u from 01/25/21 hsp visit. Reports she has been feeling well since her d/c. She would like to discuss the length of need for her plavix rx. Has not been taking Crestor due to muscle ached and hear palpitations. She has been off this med for two weeks now.    HPI:   [name] is a 65 y.o. female with history of HTN, and DM type II who presented on 01/25/2021 with left eye vision changes after seen by her ophthalmologist who identified emboli with dilated funduscopic exam.personally reviewed hospitalization pertinent progress notes, lab work and imaging summary provided.  Evaluated by Dr. 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 No acute stroke noted on MRI although did show partial CRAO possibly embolic and recommend placement of loop recorder to assess for possible A. fib as etiology.  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  EF 60%.  Recommended DAPT for 3 weeks then Plavix alone as on aspirin PTA.  LDL 134 -increase home dose Crestor 20 mg to 40 mg daily.  A1c 6.0 on Glucophage.  HTN stable on Cozaar and hydrodiuril.  PT/OT no therapy needs and advised no driving until cleared by ophthalmology.  Today, 03/05/2021, Sandy Ward is being seen for hospital follow-up.  Reports residual OS bottom of visual field "little smudge" area but overall improving - was seen by ophthalmology without residual clot. She has since returned back to driving without difficulty.  Denies new stroke/TIA symptoms.  Completed 3 weeks DAPT and remains on Plavix alone without  associated side effects.  She does question ongoing need of Plavix -having a difficult time with refill.  She has self discontinued Crestor approx 2 weeks ago due to muscle aches and heart palpitations which subsided after stopping. She does admit to not taking Crestor routinely PTA. Cardiac monitor started 3 weeks ago - will be done next Tuesday. Blood pressure today 132/78 - routinely monitors at home and typically 110-120s/70s in AM but in the evening can be 100s/50s. At times can feel lightheaded sensation - PCP decreased losartan in April. She does have appointment with cards in September. Occasionally monitors glucose levels at home and typically stable - denies any low readings.  Does report tobacco use since age of 27.  She has not yet been contacted by IR for follow-up visit regarding cerebral aneurysm.  No further concerns at this      ROS:   14 system review of systems performed and negative with exception of those listed in HPI  PMH:  Past Medical History:  Diagnosis Date   Diabetes mellitus without complication (HCC)    Hypertension     PSH: No past surgical history on file.  Social History:  Social History   Socioeconomic History   Marital status: Married    Spouse name: Not on file   Number of children: Not on file   Years of education: Not on file   Highest education level: Not on file  Occupational History   Not on file  Tobacco Use   Smoking status: Never   Smokeless tobacco: Never  Substance and Sexual Activity   Alcohol use: Not on file   Drug use: Not on file   Sexual activity: Not on file  Other Topics Concern   Not on file  Social History Narrative   Not on file   Social Determinants of Health   Financial Resource Strain: Not on file  Food Insecurity: Not on file  Transportation Needs: Not on file  Physical Activity: Not on file  Stress: Not on file  Social Connections: Not on file  Intimate Partner Violence: Not on file    Family History: No  family history on file.  Medications:   Current Outpatient Medications on File Prior to Visit  Medication Sig Dispense Refill   fluticasone (FLONASE) 50 MCG/ACT nasal spray Place 1 spray into both nostrils daily.     hydrochlorothiazide (HYDRODIURIL) 25 MG tablet Take 25 mg by mouth daily.     losartan (COZAAR) 50 MG tablet Take 50 mg by mouth daily.     metFORMIN (GLUCOPHAGE) 500 MG tablet Take 500 mg by mouth daily.     triamcinolone cream (KENALOG) 0.1 % Apply 1 application topically 2 (two) times daily as needed for skin breakdown.     valACYclovir (VALTREX) 1000 MG tablet Take 1,000 mg by mouth as needed.     No current facility-administered medications on file prior to visit.    Allergies:  No Known Allergies    OBJECTIVE:  Physical Exam  Vitals:   03/05/21 0913  BP: 132/78  Pulse: 69  Weight: 217 lb (98.4 kg)  Height: 5\' 6"  (1.676 m)   Body mass index is 35.02 kg/m. No results found.  Post stroke PHQ 2/9 Depression screen PHQ 2/9 03/05/2021  Decreased Interest 0  Down, Depressed, Hopeless 0  PHQ - 2 Score 0     General: well developed, well nourished, very pleasant middle-age Caucasian female, seated, in no evident distress Head: head normocephalic and atraumatic.   Neck: supple with no carotid or supraclavicular bruits Cardiovascular: regular rate and rhythm, no murmurs Musculoskeletal: no deformity Skin:  no rash/petichiae Vascular:  Normal pulses all extremities   Neurologic Exam Mental Status: Awake and fully alert.  Fluent speech and language.  Oriented to place and time. Recent and remote memory intact. Attention span, concentration and fund of knowledge appropriate. Mood and affect appropriate.  Cranial Nerves: Fundoscopic exam reveals sharp disc margins. Pupils equal, briskly reactive to light. Extraocular movements full without nystagmus. Visual fields full to confrontation. Hearing intact. Facial sensation intact. Face, tongue, palate moves normally  and symmetrically.  Motor: Normal bulk and tone. Normal strength in all tested extremity muscles Sensory.: intact to touch , pinprick , position and vibratory sensation.  Coordination: Rapid alternating movements normal in all extremities. Finger-to-nose and heel-to-shin performed accurately bilaterally. Gait and Station: Arises from chair without difficulty. Stance is normal. Gait demonstrates normal stride length and balance without use of assistive device.  Reflexes: 1+ and symmetric. Toes downgoing.     NIHSS  0 Modified Rankin  1      ASSESSMENT: Sandy Ward is a 65 y.o. 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 fib.  Vascular risk factors include HTN, HLD, DM and incidental finding of 84mm R ICA aneurysm.     PLAN:  OS BRAO :  Residual deficit: mild blurring OS lower visual field -overall improving routinely followed by  ophthalmology.  Complete 30-day cardiac event monitor next week to rule out A. fib Continue clopidogrel 75 mg daily  and advised to start Zetia 10 mg daily for secondary stroke prevention.  Refill request has provided per request ongoing refills by PCP as this will be a lifelong medication Discussed secondary stroke prevention measures and importance of close PCP follow up for aggressive stroke risk factor management. I have gone over the pathophysiology of stroke, warning signs and symptoms, risk factors and their management in some detail with instructions to go to the closest emergency room for symptoms of concern. HTN: BP goal <130/90.  Stable although on lower side on current regimen -advised her to monitor blood pressure daily with logs and to further discuss with cardiology at follow-up visit next month HLD: LDL goal <70. Recent LDL 134 -start Zetia 10 mg daily.  History of statin intolerance. Will plan on repeating lipid panel at follow-up with DMII: A1c goal<7.0. Recent A1c 6.0 on  Glucophage.  Monitored by PCP Cerebral aneurysm: Referral placed to IR Dr. Corliss Skains recent hospital discharge - will reach out to IR schedulers to further assist with scheduling visit.  Denies prior aneurysm history or family history Tobacco use: Discussed importance of complete tobacco cessation    Follow up in 3 months or call earlier if needed   CC:  [facility] provider: Dr. Pearlean Brownie PCP: Medicine, [facility]    I spent 46 minutes of face-to-face and non-face-to-face time with patient.  This included previsit chart review including review of recent hospitalization, lab review, study review, order entry, electronic health record documentation, patient education regarding recent stroke and etiology, secondary stroke prevention measures and aggressive stroke risk factor management of above topics, residual deficits and hopeful further recovery,  and answered all questions to patient satisfaction   Ihor Austin, AGNP-BC  [facility] [address] [place], [place] [address]  Phone [phone] Fax [phone] Note: This document was prepared with digital dictation and possible smart phrase technology. Any transcriptional errors that result from this process are unintentional.

## 2021-03-05 NOTE — Patient Instructions (Addendum)
Continue clopidogrel 75 mg daily and start Zetia 10mg  daily for secondary stroke prevention.   Complete 30-day cardiac event monitor to assess for possible atrial fibrillation (irregular heart rhythm)   Continue to follow up with PCP regarding cholesterol, blood pressure and diabetes management  Maintain strict control of hypertension with blood pressure goal below 130/90, diabetes with hemoglobin A1c goal below 7% and cholesterol with LDL cholesterol (bad cholesterol) goal below 70 mg/dL.   Ensure routine monitoring of your blood pressures with daily logging - bring this with you to your cardiology in September to discuss further treatment options. Please ensure you increase your water intake to at least 64 oz of water per day  You will be contacted by Dr. October to schedule evaluation regarding your cerebral aneurysm     Followup in the future with me in 3 months or call earlier if needed     Thank you for coming to see Corliss Skains at [facility]. I hope we have been able to provide you high quality care today.  You may receive a patient satisfaction survey over the next few weeks. We would appreciate your feedback and comments so that we may continue to improve ourselves and the health of our patients.    Cerebral Aneurysm  A cerebral aneurysm is a bulge that occurs in a blood vessel (artery) inside the brain. An aneurysm is caused when a weakened part of the blood vessel expands. The blood vessel expands due to the constant pressure from the flow of blood through the weakened blood vessel. As the aneurysm expands, thewalls of the aneurysm become weaker. Aneurysms are dangerous because they can leak or burst (rupture). When a cerebral aneurysm ruptures, it causes bleeding in the brain (subarachnoid hemorrhage). The blood flow to the area of the brain supplied by the artery is alsoreduced. This can cause a stroke, seizures, or a coma. A ruptured cerebral aneurysm is a  medical emergency. This can cause permanentbrain damage or death. What are the causes? The exact cause of this condition is not known. What increases the risk? The following factors may make you more likely to develop this condition: Being older. The condition is most common in people between the ages of 34 and 3. Being female. Having a family history of aneurysm in two or more direct relatives. Having certain conditions that are passed along from parent to child (inherited). They include: Autosomal dominant polycystic kidney disease. This is a condition in which small, fluid-filled sacs (cysts) develop in the kidney. Neurofibromatosis type 1. In this condition, flat spots develop under the skin (pigmentation) and tumors grow along nerves in the skin, brain, and other parts of the body. Ehlers-Danlos syndrome. This is a condition in which bad connective tissue causes loose or unstable joints and creates a very soft skin that bruises or tears easily. Smoking. Having high blood pressure (hypertension). Abusing alcohol. What are the signs or symptoms? The symptoms of a cerebral aneurysm that has not leaked or ruptured can depend on its size and rate of growth. A small, unchanging aneurysm generally does not cause symptoms. A larger aneurysm that is steadily growing can increase pressure on the brain or nerves. This increased pressure can cause: A headache. Vision problems. Numbness or weakness in an arm or leg. Memory problems. Problems speaking. Seizures. If an aneurysm leaks or ruptures, it can cause a life-threatening condition, such as a stroke. Symptoms may include: A sudden, severe headache with no known cause. The headache is often described  as the worst headache ever experienced. Stiff neck. Nausea or vomiting, especially when combined with other symptoms, such as a headache. Sudden weakness or numbness of the face, arm, or leg, especially on one side of the body. Sudden trouble  walking or difficulty moving the arms or legs. Double vision or sudden trouble seeing in one or both eyes. Trouble speaking or understanding speech. Trouble swallowing. Dizziness. Loss of balance or coordination. Intolerance to light. Sudden confusion or loss of consciousness. How is this diagnosed? This condition is diagnosed using certain tests, including: CT scan. Computed tomographic angiogram (CTA). This test uses a dye and a scanner to produce images of your blood vessels. Magnetic resonance angiogram (MRA). This test uses an MRI machine to produce images of your blood vessels. Digital subtraction angiogram (DSA). This test involves placing a long, thin tube (catheter) into the artery in your thigh and guiding it up to the arteries in the brain. A dye is then injected into the area, and X-rays are taken to create images of your blood vessels. How is this treated? Unruptured aneurysm Treatment for an aneurysm that is not causing problems will depend on many factors, such as the size and location of the aneurysm, your age, your overall health, and your preferences. Small aneurysms in certain locations of the brain have a very low chance of bleeding or rupturing. These small aneurysms may not need to be treated. Your health care provider may monitor the aneurysmregularly to check for any changes. In some cases, however, treatment may be required because of the size or location of an aneurysm. Treatment options may include: Coiling. During this procedure, a catheter is inserted and advanced through a blood vessel. Once the catheter reaches the aneurysm, tiny coils are used to block blood flow into the aneurysm. This procedure is sometimes done at the same time as a DSA. Surgical clipping. During surgery, a clip is placed at the base of the aneurysm. The clip prevents blood from continuing to enter the aneurysm. Flow diversion. This procedure is used to divert blood flow around the aneurysm with  a stent that is placed across the opening of an aneurysm. Ruptured aneurysm For a ruptured aneurysm, emergency surgery or coiling is often needed rightaway to help prevent damage to the brain and to reduce the risk of rebleeding. Follow these instructions at home: If your aneurysm is not treated: Take over-the-counter and prescription medicines only as told by your health care provider. Follow a diet suggested by your health care provider. Certain dietary changes may be advised to address hypertension, such as choosing foods that are low in salt (sodium), saturated fat, trans fat, and cholesterol. Stay physically active. Try to get at least 30 minutes of activity on most or all days of the week. Do not use any products that contain nicotine or tobacco. These products include cigarettes, chewing tobacco, and vaping devices, such as e-cigarettes. If you need help quitting, ask your health care provider. If you drink alcohol: Limit how much you have to: 0-1 drink a day for women who are not pregnant. 0-2 drinks a day for men. Know how much alcohol is in your drink. In the [place] one drink equals one 12 oz bottle of beer (355 mL), one 5 oz glass of wine (148 mL), or one 1 oz glass of hard liquor (44 mL). Do not use drugs. If you need help quitting, ask your health care provider. Keep all follow-up visits. This is important. This includes any referrals, imaging  studies, and lab tests. Proper follow-up may prevent an aneurysm rupture or a stroke. Get help right away if: You have a sudden, severe headache with no known cause. This may include a stiff neck. You have sudden nausea or vomiting with a severe headache. You have a seizure. You have other symptoms of a stroke. "BE FAST" is an easy way to remember the main warning signs of a stroke: B - Balance. Signs are dizziness, sudden trouble walking, or loss of balance. E - Eyes. Signs are trouble seeing or a sudden change in vision. F - Face. Signs  are sudden weakness or numbness of the face, or the face or eyelid drooping on one side. A - Arms. Signs are weakness or numbness in an arm. This happens suddenly and usually on one side of the body. S - Speech. Signs are sudden trouble speaking, slurred speech, or trouble understanding what people say. T - Time. Time to call emergency services. Write down what time symptoms started. These symptoms may represent a serious problem that is an emergency. Do not wait to see if the symptoms will go away. Get medical help right away. Call your local emergency services (911 in the [place] Do not drive yourself to the hospital. Summary A cerebral aneurysm is a bulge that occurs in a blood vessel (artery) inside the brain. Aneurysms are dangerous because they can leak or burst (rupture). When a cerebral aneurysm ruptures, it causes bleeding in the brain. Treatment depends on many factors, including the size and location of the aneurysm and whether it is ruptured. A ruptured aneurysm is a medical emergency. Get help right away if you have symptoms of a stroke. "BE FAST" is an easy way to remember the main warning signs of a stroke. This information is not intended to replace advice given to you by your health care provider. Make sure you discuss any questions you have with your healthcare provider. Document Revised: 04/10/2020 Document Reviewed: 04/10/2020 Elsevier Patient Education  2022 Elsevier Inc.      Ezetimibe Tablets What is this medication? EZETIMIBE (ez ET i mibe) treats high cholesterol. It works by reducing the amount of cholesterol absorbed from the food you eat. This decreases the amount of bad cholesterol (such as LDL) in your blood. Changes to diet and exerciseare often combined with this medication. This medicine may be used for other purposes; ask your health care provider orpharmacist if you have questions. COMMON BRAND NAME(S): Zetia What should I tell my care team before I take this  medication? They need to know if you have any of these conditions: Kidney disease Liver disease Muscle cramps, pain Muscle injury Thyroid disease An unusual or allergic reaction to ezetimibe, other medications, foods, dyes, or preservatives Pregnant or trying to get pregnant Breast-feeding How should I use this medication? Take this medication by mouth. Take it as directed on the prescription label at the same time every day. You can take it with or without food. If it upsets your stomach, take it with food. Keep taking it unless your care team tells youto stop. Take bile acid sequestrants at a different time of day than this medication.Take this medication 2 hours BEFORE or 4 hours AFTER bile acid sequestrants. Talk to your care team about the use of this medication in children. While it may be prescribed for children as young as 10 for selected conditions,precautions do apply. Overdosage: If you think you have taken too much of this medicine contact apoison control center or  emergency room at once. NOTE: This medicine is only for you. Do not share this medicine with others. What if I miss a dose? If you miss a dose, take it as soon as you can. If it is almost time for yournext dose, take only that dose. Do not take double or extra doses. What may interact with this medication? Do not take this medication with any of the following: Fenofibrate Gemfibrozil This medication may also interact with the following: Antacids Cyclosporine Herbal medications like red yeast rice Other medications to lower cholesterol or triglycerides This list may not describe all possible interactions. Give your health care provider a list of all the medicines, herbs, non-prescription drugs, or dietary supplements you use. Also tell them if you smoke, drink alcohol, or use illegaldrugs. Some items may interact with your medicine. What should I watch for while using this medication? Visit your care team for  regular checks on your progress. Tell your care teamif your symptoms do not start to get better or if they get worse. Your care team may tell you to stop taking this medication if you develop muscle problems. If your muscle problems do not go away after stopping thismedication, contact your care team. Do not become pregnant while taking this medication. Women should inform their care team if they wish to become pregnant or think they might be pregnant. There is potential for serious harm to an unborn child. Talk to your care teamfor more information. Do not breast-feed an infant while taking this medication. Taking this medication is only part of a total heart healthy program. Your care team may give you a special diet to follow. Avoid alcohol. Avoid smoking. Askyour care team how much you should exercise. What side effects may I notice from receiving this medication? Side effects that you should report to your doctor or health care provider assoon as possible: Allergic reactions-skin rash, itching or hives, swelling of the face, lips, tongue, or throat Side effects that usually do not require medical attention (report to yourdoctor or health care provider if they continue or are bothersome): Diarrhea Joint pain This list may not describe all possible side effects. Call your doctor for medical advice about side effects. You may report side effects to FDA at1-800-FDA-1088. Where should I keep my medication? Keep out of the reach of children and pets. Store at room temperature between 15 and 30 degrees C (59 and 86 degrees F). Protect from moisture. Get rid of any unused medication after the expirationdate. NOTE: This sheet is a summary. It may not cover all possible information. If you have questions about this medicine, talk to your doctor, pharmacist, orhealth care provider.  2022 Elsevier/Gold Standard (2020-08-17 12:29:13)

## 2021-03-07 ENCOUNTER — Inpatient Hospital Stay: Payer: BC Managed Care – PPO | Admitting: Adult Health

## 2021-03-07 NOTE — Progress Notes (Signed)
I agree with the above plan 

## 2021-03-18 ENCOUNTER — Other Ambulatory Visit: Payer: Self-pay | Admitting: Physician Assistant

## 2021-03-18 DIAGNOSIS — H34232 Retinal artery branch occlusion, left eye: Secondary | ICD-10-CM

## 2021-03-18 DIAGNOSIS — I4891 Unspecified atrial fibrillation: Secondary | ICD-10-CM

## 2021-03-18 DIAGNOSIS — H34219 Partial retinal artery occlusion, unspecified eye: Secondary | ICD-10-CM

## 2021-04-22 ENCOUNTER — Telehealth (HOSPITAL_COMMUNITY): Payer: Self-pay

## 2021-04-22 NOTE — Telephone Encounter (Signed)
Called to schedule consult, no answer, left vm. AW  

## 2021-06-05 ENCOUNTER — Ambulatory Visit: Payer: BC Managed Care – PPO | Admitting: Adult Health
